# Patient Record
Sex: Female | Born: 2000 | Race: Black or African American | Hispanic: No | Smoking: Never smoker
Health system: Southern US, Community
[De-identification: ages and names within clinical notes are randomized; demographics above are authoritative.]

## PROBLEM LIST (undated history)

## (undated) DIAGNOSIS — Z789 Other specified health status: Secondary | ICD-10-CM

## (undated) HISTORY — PX: NO PAST SURGERIES: SHX2092

## (undated) HISTORY — DX: Other specified health status: Z78.9

---
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
## 2019-06-17 ENCOUNTER — Telehealth: Payer: Self-pay | Admitting: Adult Health

## 2019-06-17 NOTE — Telephone Encounter (Signed)

## 2019-06-18 ENCOUNTER — Encounter: Payer: Self-pay | Admitting: Adult Health

## 2020-03-26 NOTE — L&D Delivery Note (Signed)
[facility] Delivery Note  Sharon Webb is a 20 y.o. G1P0 at [redacted]w[redacted]d. She was admitted for SOL.   ROM: 10h 37m with clear fluid GBS Status: Neg Maximum Maternal Temperature: 98.7*F  Labor Progress: Patient presented in active labor and progressed to complete with expectant management.  Delivery Date/Time: 11/09/20 at 1426 Delivery: Called to room and patient was complete and pushing. Head delivered LOA. No nuchal cord present. Shoulder and body delivered in usual fashion. Infant with spontaneous cry, placed on mother's abdomen, dried and stimulated. Cord clamped x 2 after 1-minute delay, and cut by FOB. Cord blood drawn. Placenta delivered spontaneously with gentle cord traction. Fundus firm with massage and Pitocin. Labia, perineum, vagina, and cervix inspected with left labial laceration and second degree perineal laceration.   Placenta: intact, 3VC Complications: none Lacerations: left labial and 2nd degree perineal repaired with 3-0 vicryl on CT EBL: 200cc Analgesia: epidural  Postpartum Planning [ ]  message to sent to schedule follow-up  [ ]  vaccines UTD  Infant: viable female  APGARs 9,9  weight pending medical chart  , MD [facility], PGY-3 11/09/2020, 3:06 PM

## 2020-04-19 ENCOUNTER — Encounter (INDEPENDENT_AMBULATORY_CARE_PROVIDER_SITE_OTHER): Payer: Self-pay

## 2020-04-19 ENCOUNTER — Encounter: Payer: Self-pay | Admitting: Adult Health

## 2020-04-19 ENCOUNTER — Encounter: Payer: Medicaid Other | Admitting: Adult Health

## 2020-04-19 ENCOUNTER — Other Ambulatory Visit: Payer: Self-pay

## 2020-04-19 ENCOUNTER — Ambulatory Visit (INDEPENDENT_AMBULATORY_CARE_PROVIDER_SITE_OTHER): Payer: Medicaid Other | Admitting: Adult Health

## 2020-04-19 VITALS — BP 132/91 | HR 81 | Ht 67.0 in | Wt 108.0 lb

## 2020-04-19 DIAGNOSIS — O3680X0 Pregnancy with inconclusive fetal viability, not applicable or unspecified: Secondary | ICD-10-CM | POA: Diagnosis not present

## 2020-04-19 DIAGNOSIS — Z3201 Encounter for pregnancy test, result positive: Secondary | ICD-10-CM | POA: Diagnosis not present

## 2020-04-19 DIAGNOSIS — Z3A13 13 weeks gestation of pregnancy: Secondary | ICD-10-CM | POA: Diagnosis not present

## 2020-04-19 LAB — POCT URINE PREGNANCY: Preg Test, Ur: POSITIVE — AB

## 2020-04-19 MED ORDER — FLINTSTONES COMPLETE 18 MG PO CHEW: CHEWABLE_TABLET | ORAL | Status: DC

## 2020-04-19 NOTE — Patient Instructions (Signed)
Obstetrics: Normal and Problem Pregnancies (7th ed., pp. 102-121). [place], [place]: Elsevier."> Textbook of Family Medicine (9th ed., pp. 365-410). [place], [place]: [employer].">  First Trimester of Pregnancy  The first trimester of pregnancy starts on the first day of your last menstrual period until the end of week 12. This is months 1 through 3 of pregnancy. A week after a sperm fertilizes an egg, the egg will implant into the wall of the uterus and begin to develop into a baby. By the end of 12 weeks, all the baby's organs will be formed and the baby will be 2-3 inches in size. Body changes during your first trimester Your body goes through many changes during pregnancy. The changes vary and generally return to normal after your baby is born. Physical changes  You may gain or lose weight.  Your breasts may begin to grow larger and become tender. The tissue that surrounds your nipples (areola) may become darker.  Dark spots or blotches (chloasma or mask of pregnancy) may develop on your face.  You may have changes in your hair. These can include thickening or thinning of your hair or changes in texture. Health changes  You may feel nauseous, and you may vomit.  You may have heartburn.  You may develop headaches.  You may develop constipation.  Your gums may bleed and may be sensitive to brushing and flossing. Other changes  You may tire easily.  You may urinate more often.  Your menstrual periods will stop.  You may have a loss of appetite.  You may develop cravings for certain kinds of food.  You may have changes in your emotions from day to day.  You may have more vivid and strange dreams. Follow these instructions at home: Medicines  Follow your health care provider's instructions regarding medicine use. Specific medicines may be either safe or unsafe to take during pregnancy. Do not take any medicines unless told to by your health care provider.  Take a  prenatal vitamin that contains at least 600 micrograms (mcg) of folic acid. Eating and drinking  Eat a healthy diet that includes fresh fruits and vegetables, whole grains, good sources of protein such as meat, eggs, or tofu, and low-fat dairy products.  Avoid raw meat and unpasteurized juice, milk, and cheese. These carry germs that can harm you and your baby.  If you feel nauseous or you vomit: ? Eat 4 or 5 small meals a day instead of 3 large meals. ? Try eating a few soda crackers. ? Drink liquids between meals instead of during meals.  You may need to take these actions to prevent or treat constipation: ? Drink enough fluid to keep your urine pale yellow. ? Eat foods that are high in fiber, such as beans, whole grains, and fresh fruits and vegetables. ? Limit foods that are high in fat and processed sugars, such as fried or sweet foods. Activity  Exercise only as directed by your health care provider. Most people can continue their usual exercise routine during pregnancy. Try to exercise for 30 minutes at least 5 days a week.  Stop exercising if you develop pain or cramping in the lower abdomen or lower back.  Avoid exercising if it is very hot or humid or if you are at high altitude.  Avoid heavy lifting.  If you choose to, you may have sex unless your health care provider tells you not to. Relieving pain and discomfort  Wear a good support bra to relieve breast   tenderness.  Rest with your legs elevated if you have leg cramps or low back pain.  If you develop bulging veins (varicose veins) in your legs: ? Wear support hose as told by your health care provider. ? Elevate your feet for 15 minutes, 3-4 times a day. ? Limit salt in your diet. Safety  Wear your seat belt at all times when driving or riding in a car.  Talk with your health care provider if someone is verbally or physically abusive to you.  Talk with your health care provider if you are feeling sad or have  thoughts of hurting yourself. Lifestyle  Do not use hot tubs, steam rooms, or saunas.  Do not douche. Do not use tampons or scented sanitary pads.  Do not use herbal remedies, alcohol, illegal drugs, or medicines that are not approved by your health care provider. Chemicals in these products can harm your baby.  Do not use any products that contain nicotine or tobacco, such as cigarettes, e-cigarettes, and chewing tobacco. If you need help quitting, ask your health care provider.  Avoid cat litter boxes and soil used by cats. These carry germs that can cause birth defects in the baby and possibly loss of the unborn baby (fetus) by miscarriage or stillbirth. General instructions  During routine prenatal visits in the first trimester, your health care provider will do a physical exam, perform necessary tests, and ask you how things are going. Keep all follow-up visits. This is important.  Ask for help if you have counseling or nutritional needs during pregnancy. Your health care provider can offer advice or refer you to specialists for help with various needs.  Schedule a dentist appointment. At home, brush your teeth with a soft toothbrush. Floss gently.  Write down your questions. Take them to your prenatal visits. Where to find more information  American Pregnancy Association: americanpregnancy.org  American College of Obstetricians and Gynecologists: acog.org/en/Womens%20Health/Pregnancy  Office on Women's Health: womenshealth.gov/pregnancy Contact a health care provider if you have:  Dizziness.  A fever.  Mild pelvic cramps, pelvic pressure, or nagging pain in the abdominal area.  Nausea, vomiting, or diarrhea that lasts for 24 hours or longer.  A bad-smelling vaginal discharge.  Pain when you urinate.  Known exposure to a contagious illness, such as chickenpox, measles, Zika virus, HIV, or hepatitis. Get help right away if you have:  Spotting or bleeding from your  vagina.  Severe abdominal cramping or pain.  Shortness of breath or chest pain.  Any kind of trauma, such as from a fall or a car crash.  New or increased pain, swelling, or redness in an arm or leg. Summary  The first trimester of pregnancy starts on the first day of your last menstrual period until the end of week 12 (months 1 through 3).  Eating 4 or 5 small meals a day rather than 3 large meals may help to relieve nausea and vomiting.  Do not use any products that contain nicotine or tobacco, such as cigarettes, e-cigarettes, and chewing tobacco. If you need help quitting, ask your health care provider.  Keep all follow-up visits. This is important. This information is not intended to replace advice given to you by your health care provider. Make sure you discuss any questions you have with your health care provider. Document Revised: 08/19/2019 Document Reviewed: 06/25/2019 Elsevier Patient Education  2021 [employer].  

## 2020-04-19 NOTE — Progress Notes (Signed)
  Subjective:     Patient ID: Sharon Webb, female   DOB: [birth year], 20 y.o.   MRN: [id number]  HPI Sharon Webb is a 20 year old black female,single, in for UPT, has missed periods and had 1+HPT PCP is Dr Talmage Nap.   Review of Systems +missed periods  1+HPT Some nausea  +sleepy Reviewed past medical,surgical, social and family history. Reviewed medications and allergies.     Objective:   Physical Exam BP (!) 132/91 (BP Location: Left Arm, Patient Position: Sitting, Cuff Size: Normal)   Pulse 81   Ht 5\' 7"  (1.702 m)   Wt 108 lb (49 kg)   LMP 01/14/2020   BMI 16.92 kg/m UPT +, about 13+5 weeks by LMP with EDD 10/20/20.Skin warm and dry. Neck: mid line trachea, normal thyroid, good ROM, no lymphadenopathy noted. Lungs: clear to ausculation bilaterally. Cardiovascular: regular rate and rhythm.Abdomen is soft and non tender AA is 0 Fall risk is low PHQ 9 score is 4 GAD 7 score is 2  Upstream - 04/19/20 1553      Pregnancy Intention Screening   Does the patient want to become pregnant in the next year? N/A    Does the patient's partner want to become pregnant in the next year? N/A    Would the patient like to discuss contraceptive options today? N/A      Contraception Wrap Up   Current Method Pregnant/Seeking Pregnancy    End Method Pregnant/Seeking Pregnancy    Contraception Counseling Provided No             Assessment:     1. Pregnancy examination or test, positive result Meds ordered this encounter  Medications  . Pediatric Multivitamins-Iron (FLINTSTONES COMPLETE) 18 MG CHEW    Sig: Chew 2 daily    Order Specific Question:   Supervising Provider    Answer:   04/21/20, LUTHER H [[id number]]    2. [redacted] weeks gestation of pregnancy Try hard candy and gum,eat often if any nausea   3. Encounter to determine fetal viability of pregnancy, single or unspecified fetus Dating Despina Hidden in 1 week     Plan:     Review handout on First [facility] and by [facility]

## 2020-04-26 ENCOUNTER — Other Ambulatory Visit: Payer: Self-pay | Admitting: Adult Health

## 2020-04-26 ENCOUNTER — Other Ambulatory Visit: Payer: Self-pay

## 2020-04-26 ENCOUNTER — Ambulatory Visit (INDEPENDENT_AMBULATORY_CARE_PROVIDER_SITE_OTHER): Payer: Medicaid Other

## 2020-04-26 ENCOUNTER — Other Ambulatory Visit: Payer: Medicaid Other

## 2020-04-26 DIAGNOSIS — Z3401 Encounter for supervision of normal first pregnancy, first trimester: Secondary | ICD-10-CM

## 2020-04-26 DIAGNOSIS — O3680X0 Pregnancy with inconclusive fetal viability, not applicable or unspecified: Secondary | ICD-10-CM

## 2020-04-26 DIAGNOSIS — Z3A11 11 weeks gestation of pregnancy: Secondary | ICD-10-CM | POA: Diagnosis not present

## 2020-04-26 DIAGNOSIS — Z3A14 14 weeks gestation of pregnancy: Secondary | ICD-10-CM

## 2020-04-26 DIAGNOSIS — Z3682 Encounter for antenatal screening for nuchal translucency: Secondary | ICD-10-CM | POA: Diagnosis not present

## 2020-04-26 NOTE — Progress Notes (Signed)
[place] 11+4 wks,single IUP,FHT 166 bpm,CRL 48.82 mm,normal ovaries,NB present,NT .8 mm

## 2020-04-27 ENCOUNTER — Other Ambulatory Visit: Payer: Self-pay | Admitting: Women's Health

## 2020-04-27 LAB — CBC/D/PLT+RPR+RH+ABO+RUB AB...
Hematocrit: 32.6 % — ABNORMAL LOW (ref 34.0–46.6)
Lymphs: 39 %
MCHC: 33.4 g/dL (ref 31.5–35.7)
Rh Factor: NEGATIVE

## 2020-04-27 LAB — INTEGRATED 1

## 2020-04-27 MED ORDER — FERROUS SULFATE 325 (65 FE) MG PO TABS: 325.0000 mg | ORAL_TABLET | ORAL | 2 refills | Status: DC

## 2020-04-28 LAB — CBC/D/PLT+RPR+RH+ABO+RUB AB...
Antibody Screen: NEGATIVE
Basophils Absolute: 0 10*3/uL (ref 0.0–0.2)
Basos: 1 %
EOS (ABSOLUTE): 0.3 10*3/uL (ref 0.0–0.4)
Eos: 7 %
HCV Ab: <0.1 s/co ratio (ref 0.0–0.9)
HIV Screen 4th Generation wRfx: NONREACTIVE
Hemoglobin: 10.9 g/dL — ABNORMAL LOW (ref 11.1–15.9)
Hepatitis B Surface Ag: NEGATIVE
Immature Grans (Abs): 0 10*3/uL (ref 0.0–0.1)
Immature Granulocytes: 0 %
Lymphocytes Absolute: 1.7 10*3/uL (ref 0.7–3.1)
MCH: 27.7 pg (ref 26.6–33.0)
MCV: 83 fL (ref 79–97)
Monocytes Absolute: 0.4 10*3/uL (ref 0.1–0.9)
Monocytes: 9 %
Neutrophils Absolute: 2 10*3/uL (ref 1.4–7.0)
Neutrophils: 44 %
Platelets: 292 10*3/uL (ref 150–450)
RBC: 3.94 x10E6/uL (ref 3.77–5.28)
RDW: 13.2 % (ref 11.7–15.4)
RPR Ser Ql: NONREACTIVE
Rubella Antibodies, IGG: 3.47 index (ref >0.99)
WBC: 4.5 10*3/uL (ref 3.4–10.8)

## 2020-04-28 LAB — INTEGRATED 1
Maternal Age at EDD: 21.3 yr
Nuchal Translucency (NT): 0.8 mm
Number of Fetuses: 1
PAPP-A Value: 390.8 ng/mL
Weight: 109 [lb_av]

## 2020-04-28 LAB — HCV INTERPRETATION

## 2020-05-10 DIAGNOSIS — O26899 Other specified pregnancy related conditions, unspecified trimester: Secondary | ICD-10-CM | POA: Insufficient documentation

## 2020-05-10 DIAGNOSIS — Z3400 Encounter for supervision of normal first pregnancy, unspecified trimester: Secondary | ICD-10-CM | POA: Insufficient documentation

## 2020-05-10 DIAGNOSIS — Z6791 Unspecified blood type, Rh negative: Secondary | ICD-10-CM | POA: Insufficient documentation

## 2020-05-11 ENCOUNTER — Ambulatory Visit: Payer: Medicaid Other | Admitting: *Deleted

## 2020-05-11 ENCOUNTER — Encounter: Payer: Self-pay | Admitting: Women's Health

## 2020-05-11 ENCOUNTER — Ambulatory Visit (INDEPENDENT_AMBULATORY_CARE_PROVIDER_SITE_OTHER): Payer: Medicaid Other | Admitting: Women's Health

## 2020-05-11 ENCOUNTER — Other Ambulatory Visit: Payer: Self-pay

## 2020-05-11 VITALS — BP 121/83 | HR 120 | Wt 109.0 lb

## 2020-05-11 DIAGNOSIS — Z6791 Unspecified blood type, Rh negative: Secondary | ICD-10-CM

## 2020-05-11 DIAGNOSIS — Z3A13 13 weeks gestation of pregnancy: Secondary | ICD-10-CM

## 2020-05-11 DIAGNOSIS — O26899 Other specified pregnancy related conditions, unspecified trimester: Secondary | ICD-10-CM | POA: Diagnosis not present

## 2020-05-11 DIAGNOSIS — Z3401 Encounter for supervision of normal first pregnancy, first trimester: Secondary | ICD-10-CM

## 2020-05-11 DIAGNOSIS — Z1389 Encounter for screening for other disorder: Secondary | ICD-10-CM | POA: Diagnosis not present

## 2020-05-11 LAB — POCT URINALYSIS DIPSTICK OB
Blood, UA: NEGATIVE
Glucose, UA: NEGATIVE
Ketones, UA: NEGATIVE
Leukocytes, UA: NEGATIVE
Nitrite, UA: NEGATIVE
POC,PROTEIN,UA: NEGATIVE

## 2020-05-11 MED ORDER — ASPIRIN 81 MG PO TBEC: 81.0000 mg | DELAYED_RELEASE_TABLET | Freq: Every day | ORAL | 6 refills | Status: DC

## 2020-05-11 MED ORDER — BLOOD PRESSURE CUFF MISC: 1.0000 | Freq: Every day | 0 refills | Status: DC

## 2020-05-11 MED ORDER — DOXYLAMINE-PYRIDOXINE 10-10 MG PO TBEC: DELAYED_RELEASE_TABLET | ORAL | 6 refills | Status: DC

## 2020-05-11 MED ORDER — BLOOD PRESSURE CUFF MISC: 1.0000 | Freq: Every day | 0 refills | Status: AC

## 2020-05-11 NOTE — Progress Notes (Signed)
INITIAL OBSTETRICAL VISIT Patient name: Sharon Webb MRN [id number]  Date of birth: 10-Jul-2000 Chief Complaint:   Initial Prenatal Visit  History of Present Illness:   Sharon Webb is a 20 y.o. G1P0 African American female at [redacted]w[redacted]d by [place] at 48 weeks with an Estimated Date of Delivery: 11/11/20 being seen today for her initial obstetrical visit.   Her obstetrical history is significant for primigravida.   Today she reports n/v, requests meds.  Depression screen [facility] 2/9 05/11/2020 04/19/2020 04/19/2020  Decreased Interest 0 1 1  Down, Depressed, Hopeless 0 0 0  PHQ - 2 Score 0 1 1  Altered sleeping 0 1 1  Tired, decreased energy 0 1 1  Change in appetite 0 1 1  Feeling bad or failure about yourself  0 0 0  Trouble concentrating 0 0 0  Moving slowly or fidgety/restless 0 0 0  Suicidal thoughts 0 0 0  PHQ-9 Score 0 4 4    Patient's last menstrual period was 01/14/2020. Last pap <21yo. Results were: N/A Review of Systems:   Pertinent items are noted in HPI Denies cramping/contractions, leakage of fluid, vaginal bleeding, abnormal vaginal discharge w/ itching/odor/irritation, headaches, visual changes, shortness of breath, chest pain, abdominal pain, severe nausea/vomiting, or problems with urination or bowel movements unless otherwise stated above.  Pertinent History Reviewed:  Reviewed past medical,surgical, social, obstetrical and family history.  Reviewed problem list, medications and allergies. OB History  Gravida Para Term Preterm AB Living  1            SAB IAB Ectopic Multiple Live Births               # Outcome Date GA Lbr Len/2nd Weight Sex Delivery Anes PTL Lv  1 Current            Physical Assessment:   Vitals:   05/11/20 1507  BP: 121/83  Pulse: (!) 120  Weight: 109 lb (49.4 kg)  Body mass index is 17.07 kg/m.       Physical Examination:  General appearance - well appearing, and in no distress  Mental status - alert, oriented to person, place, and  time  Psych:  She has a normal mood and affect  Skin - warm and dry, normal color, no suspicious lesions noted  Chest - effort normal, all lung fields clear to auscultation bilaterally  Heart - normal rate and regular rhythm  Abdomen - soft, nontender  Extremities:  No swelling or varicosities noted  Thin prep pap is not done  Chaperone: N/A    TODAY'S FHR via doppler: 163  Results for orders placed or performed in visit on 05/11/20 (from the past 24 hour(s))  POC Urinalysis Dipstick OB   Collection Time: 05/11/20  3:28 PM  Result Value Ref Range   Color, UA     Clarity, UA     Glucose, UA Negative Negative   Bilirubin, UA     Ketones, UA neg    Spec Grav, UA     Blood, UA neg    pH, UA     POC,PROTEIN,UA Negative Negative, Trace, Small (1+), Moderate (2+), Large (3+), 4+   Urobilinogen, UA     Nitrite, UA neg    Leukocytes, UA Negative Negative   Appearance     Odor      Assessment & Plan:  1) Low-Risk Pregnancy G1P0 at [redacted]w[redacted]d with an Estimated Date of Delivery: 11/11/20   2) Initial OB visit  3) N/V> rx diclegis  Meds:  Meds ordered this encounter  Medications  . DISCONTD: Blood Pressure Monitoring (BLOOD PRESSURE CUFF) MISC    Sig: 1 Device by Does not apply route daily.    Dispense:  1 each    Refill:  0  . Blood Pressure Monitoring (BLOOD PRESSURE CUFF) MISC    Sig: 1 Device by Does not apply route daily.    Dispense:  1 each    Refill:  0  . Doxylamine-Pyridoxine (DICLEGIS) 10-10 MG TBEC    Sig: 2 tabs q hs, if sx persist add 1 tab q am on day 3, if sx persist add 1 tab q afternoon on day 4    Dispense:  100 tablet    Refill:  6    Order Specific Question:   Supervising Provider    Answer:   Despina Hidden, LUTHER H [[id number]]  . aspirin 81 MG EC tablet    Sig: Take 1 tablet (81 mg total) by mouth daily. Swallow whole.    Dispense:  90 tablet    Refill:  6    Order Specific Question:   Supervising Provider    Answer:   Duane Lope H [[id number]]    Initial labs  obtained Continue prenatal vitamins Reviewed n/v relief measures and warning s/s to report Reviewed recommended weight gain based on pre-gravid BMI Encouraged well-balanced diet Genetic & carrier screening discussed: requests Panorama, NT/IT and Horizon 14  Ultrasound discussed; fetal survey: requested CCNC completed> form faxed if has or is planning to apply for medicaid The nature of [facility] [employer] with multiple MDs and other Advanced Practice Providers was explained to patient; also emphasized that fellows, residents, and students are part of our team. Does not have home bp cuff.  Check bp weekly, let us know if >140/90.   Indications for ASA therapy (per uptodate) OR Two or more of the following: Nulliparity Yes  Sociodemographic characteristics (African American race, low socioeconomic level) Yes   Follow-up: Return in 30 days (on 06/10/2020) for LROB, 2nd IT, HF:WYOVZCH, in person, CNM.   Orders Placed This Encounter  Procedures  . Urine Culture  . GC/Chlamydia Probe Amp  . Pain Management Screening Profile (10S)  . POC Urinalysis Dipstick OB    Cheral Marker CNM, [facility] 05/11/2020 3:59 PM

## 2020-05-11 NOTE — Patient Instructions (Signed)
Sharon Webb, I greatly value your feedback.  If you receive a survey following your visit with [place] today, we appreciate you taking the time to fill it out.  Thanks, [name], CNM, WHNP-BC   [facility] ([address] [place], [place] [address]) Entrance C, located off of [address] Free 24/7 valet parking   Nausea & Vomiting  Have saltine crackers or pretzels by your bed and eat a few bites before you raise your head out of bed in the morning  Eat small frequent meals throughout the day instead of large meals  Drink plenty of fluids throughout the day to stay hydrated, just don't drink a lot of fluids with your meals.  This can make your stomach fill up faster making you feel sick  Do not brush your teeth right after you eat  Products with real ginger are good for nausea, like ginger ale and ginger hard candy Make sure it says made with real ginger!  Sucking on sour candy like lemon heads is also good for nausea  If your prenatal vitamins make you nauseated, take them at night so you will sleep through the nausea  Sea Bands  If you feel like you need medicine for the nausea & vomiting please let us know  If you are unable to keep any fluids or food down please let us know   Constipation  Drink plenty of fluid, preferably water, throughout the day  Eat foods high in fiber such as fruits, vegetables, and grains  Exercise, such as walking, is a good way to keep your bowels regular  Drink warm fluids, especially warm prune juice, or decaf coffee  Eat a 1/2 cup of real oatmeal (not instant), 1/2 cup applesauce, and 1/2-1 cup warm prune juice every day  If needed, you may take Colace (docusate sodium) stool softener once or twice a day to help keep the stool soft.   If you still are having problems with constipation, you may take Miralax once daily as needed to help keep your bowels regular.   Home Blood Pressure Monitoring for Patients    Your provider has recommended that you check your blood pressure (BP) at least once a week at home. If you do not have a blood pressure cuff at home, one will be provided for you. Contact your provider if you have not received your monitor within 1 week.   Helpful Tips for Accurate Home Blood Pressure Checks  . Don't smoke, exercise, or drink caffeine 30 minutes before checking your BP . Use the restroom before checking your BP (a full bladder can raise your pressure) . Relax in a comfortable upright chair . Feet on the ground . Left arm resting comfortably on a flat surface at the level of your heart . Legs uncrossed . Back supported . Sit quietly and don't talk . Place the cuff on your bare arm . Adjust snuggly, so that only two fingertips can fit between your skin and the top of the cuff . Check 2 readings separated by at least one minute . Keep a log of your BP readings . For a visual, please reference this diagram: http://ccnc.care/bpdiagram  Provider Name: [facility]     Phone: [phone]  Zone 1: ALL CLEAR  Continue to monitor your symptoms:  . BP reading is less than 140 (top number) or less than 90 (bottom number)  . No right upper stomach pain . No headaches or seeing  spots . No feeling nauseated or throwing up . No swelling in face and hands  Zone 2: CAUTION Call your doctor's office for any of the following:  . BP reading is greater than 140 (top number) or greater than 90 (bottom number)  . Stomach pain under your ribs in the middle or right side . Headaches or seeing spots . Feeling nauseated or throwing up . Swelling in face and hands  Zone 3: EMERGENCY  Seek immediate medical care if you have any of the following:  . BP reading is greater than160 (top number) or greater than 110 (bottom number) . Severe headaches not improving with Tylenol . Serious difficulty catching your breath . Any worsening symptoms from Zone 2    First Trimester of  Pregnancy The first trimester of pregnancy is from week 1 until the end of week 12 (months 1 through 3). A week after a sperm fertilizes an egg, the egg will implant on the wall of the uterus. This embryo will begin to develop into a baby. Genes from you and your partner are forming the baby. The female genes determine whether the baby is a boy or a girl. At 6-8 weeks, the eyes and face are formed, and the heartbeat can be seen on ultrasound. At the end of 12 weeks, all the baby's organs are formed.  Now that you are pregnant, you will want to do everything you can to have a healthy baby. Two of the most important things are to get good prenatal care and to follow your health care provider's instructions. Prenatal care is all the medical care you receive before the baby's birth. This care will help prevent, find, and treat any problems during the pregnancy and childbirth. BODY CHANGES Your body goes through many changes during pregnancy. The changes vary from woman to woman.   You may gain or lose a couple of pounds at first.  You may feel sick to your stomach (nauseous) and throw up (vomit). If the vomiting is uncontrollable, call your health care provider.  You may tire easily.  You may develop headaches that can be relieved by medicines approved by your health care provider.  You may urinate more often. Painful urination may mean you have a bladder infection.  You may develop heartburn as a result of your pregnancy.  You may develop constipation because certain hormones are causing the muscles that push waste through your intestines to slow down.  You may develop hemorrhoids or swollen, bulging veins (varicose veins).  Your breasts may begin to grow larger and become tender. Your nipples may stick out more, and the tissue that surrounds them (areola) may become darker.  Your gums may bleed and may be sensitive to brushing and flossing.  Dark spots or blotches (chloasma, mask of pregnancy)  may develop on your face. This will likely fade after the baby is born.  Your menstrual periods will stop.  You may have a loss of appetite.  You may develop cravings for certain kinds of food.  You may have changes in your emotions from day to day, such as being excited to be pregnant or being concerned that something may go wrong with the pregnancy and baby.  You may have more vivid and strange dreams.  You may have changes in your hair. These can include thickening of your hair, rapid growth, and changes in texture. Some women also have hair loss during or after pregnancy, or hair that feels dry or thin. Your hair  will most likely return to normal after your baby is born. WHAT TO EXPECT AT YOUR PRENATAL VISITS During a routine prenatal visit:  You will be weighed to make sure you and the baby are growing normally.  Your blood pressure will be taken.  Your abdomen will be measured to track your baby's growth.  The fetal heartbeat will be listened to starting around week 10 or 12 of your pregnancy.  Test results from any previous visits will be discussed. Your health care provider may ask you:  How you are feeling.  If you are feeling the baby move.  If you have had any abnormal symptoms, such as leaking fluid, bleeding, severe headaches, or abdominal cramping.  If you have any questions. Other tests that may be performed during your first trimester include:  Blood tests to find your blood type and to check for the presence of any previous infections. They will also be used to check for low iron levels (anemia) and Rh antibodies. Later in the pregnancy, blood tests for diabetes will be done along with other tests if problems develop.  Urine tests to check for infections, diabetes, or protein in the urine.  An ultrasound to confirm the proper growth and development of the baby.  An amniocentesis to check for possible genetic problems.  Fetal screens for spina bifida and  Down syndrome.  You may need other tests to make sure you and the baby are doing well. HOME CARE INSTRUCTIONS  Medicines  Follow your health care provider's instructions regarding medicine use. Specific medicines may be either safe or unsafe to take during pregnancy.  Take your prenatal vitamins as directed.  If you develop constipation, try taking a stool softener if your health care provider approves. Diet  Eat regular, well-balanced meals. Choose a variety of foods, such as meat or vegetable-based protein, fish, milk and low-fat dairy products, vegetables, fruits, and whole grain breads and cereals. Your health care provider will help you determine the amount of weight gain that is right for you.  Avoid raw meat and uncooked cheese. These carry germs that can cause birth defects in the baby.  Eating four or five small meals rather than three large meals a day may help relieve nausea and vomiting. If you start to feel nauseous, eating a few soda crackers can be helpful. Drinking liquids between meals instead of during meals also seems to help nausea and vomiting.  If you develop constipation, eat more high-fiber foods, such as fresh vegetables or fruit and whole grains. Drink enough fluids to keep your urine clear or pale yellow. Activity and Exercise  Exercise only as directed by your health care provider. Exercising will help you:  Control your weight.  Stay in shape.  Be prepared for labor and delivery.  Experiencing pain or cramping in the lower abdomen or low back is a good sign that you should stop exercising. Check with your health care provider before continuing normal exercises.  Try to avoid standing for long periods of time. Move your legs often if you must stand in one place for a long time.  Avoid heavy lifting.  Wear low-heeled shoes, and practice good posture.  You may continue to have sex unless your health care provider directs you otherwise. Relief of Pain  or Discomfort  Wear a good support bra for breast tenderness.    Take warm sitz baths to soothe any pain or discomfort caused by hemorrhoids. Use hemorrhoid cream if your health care provider approves.  Rest with your legs elevated if you have leg cramps or low back pain.  If you develop varicose veins in your legs, wear support hose. Elevate your feet for 15 minutes, 3-4 times a day. Limit salt in your diet. Prenatal Care  Schedule your prenatal visits by the twelfth week of pregnancy. They are usually scheduled monthly at first, then more often in the last 2 months before delivery.  Write down your questions. Take them to your prenatal visits.  Keep all your prenatal visits as directed by your health care provider. Safety  Wear your seat belt at all times when driving.  Make a list of emergency phone numbers, including numbers for family, friends, the hospital, and police and fire departments. General Tips  Ask your health care provider for a referral to a local prenatal education class. Begin classes no later than at the beginning of month 6 of your pregnancy.  Ask for help if you have counseling or nutritional needs during pregnancy. Your health care provider can offer advice or refer you to specialists for help with various needs.  Do not use hot tubs, steam rooms, or saunas.  Do not douche or use tampons or scented sanitary pads.  Do not cross your legs for long periods of time.  Avoid cat litter boxes and soil used by cats. These carry germs that can cause birth defects in the baby and possibly loss of the fetus by miscarriage or stillbirth.  Avoid all smoking, herbs, alcohol, and medicines not prescribed by your health care provider. Chemicals in these affect the formation and growth of the baby.  Schedule a dentist appointment. At home, brush your teeth with a soft toothbrush and be gentle when you floss. SEEK MEDICAL CARE IF:   You have dizziness.  You have mild  pelvic cramps, pelvic pressure, or nagging pain in the abdominal area.  You have persistent nausea, vomiting, or diarrhea.  You have a bad smelling vaginal discharge.  You have pain with urination.  You notice increased swelling in your face, hands, legs, or ankles. SEEK IMMEDIATE MEDICAL CARE IF:   You have a fever.  You are leaking fluid from your vagina.  You have spotting or bleeding from your vagina.  You have severe abdominal cramping or pain.  You have rapid weight gain or loss.  You vomit blood or material that looks like coffee grounds.  You are exposed to [place] measles and have never had them.  You are exposed to fifth disease or chickenpox.  You develop a severe headache.  You have shortness of breath.  You have any kind of trauma, such as from a fall or a car accident. Document Released: 03/06/2001 Document Revised: 07/27/2013 Document Reviewed: 01/20/2013 [facility] Patient Information 2015 [place], [place]. This information is not intended to replace advice given to you by your health care provider. Make sure you discuss any questions you have with your health care provider.

## 2020-05-12 LAB — PMP SCREEN PROFILE (10S), URINE
Amphetamine Scrn, Ur: NEGATIVE ng/mL
BENZODIAZEPINE SCREEN, URINE: NEGATIVE ng/mL
Ph of Urine: 6.2 (ref 4.5–8.9)
Phencyclidine Qn, Ur: NEGATIVE ng/mL

## 2020-05-13 LAB — PMP SCREEN PROFILE (10S), URINE
BARBITURATE SCREEN URINE: NEGATIVE ng/mL
CANNABINOIDS UR QL SCN: NEGATIVE ng/mL
Creatinine(Crt), U: 122.4 mg/dL (ref 20.0–300.0)
Methadone Screen, Urine: NEGATIVE ng/mL
OXYCODONE+OXYMORPHONE UR QL SCN: NEGATIVE ng/mL
Opiate Scrn, Ur: NEGATIVE ng/mL
Propoxyphene Scrn, Ur: NEGATIVE ng/mL

## 2020-05-13 LAB — URINE CULTURE

## 2020-05-24 ENCOUNTER — Encounter: Payer: Self-pay | Admitting: Women's Health

## 2020-05-24 DIAGNOSIS — D573 Sickle-cell trait: Secondary | ICD-10-CM | POA: Insufficient documentation

## 2020-06-10 ENCOUNTER — Other Ambulatory Visit: Payer: Self-pay | Admitting: Women's Health

## 2020-06-10 DIAGNOSIS — Z363 Encounter for antenatal screening for malformations: Secondary | ICD-10-CM

## 2020-06-13 ENCOUNTER — Encounter: Payer: Self-pay | Admitting: Women's Health

## 2020-06-13 ENCOUNTER — Other Ambulatory Visit: Payer: Self-pay

## 2020-06-13 ENCOUNTER — Ambulatory Visit (INDEPENDENT_AMBULATORY_CARE_PROVIDER_SITE_OTHER): Payer: Medicaid Other

## 2020-06-13 ENCOUNTER — Ambulatory Visit (INDEPENDENT_AMBULATORY_CARE_PROVIDER_SITE_OTHER): Payer: Medicaid Other | Admitting: Women's Health

## 2020-06-13 VITALS — BP 112/68 | HR 91 | Wt 118.0 lb

## 2020-06-13 DIAGNOSIS — D573 Sickle-cell trait: Secondary | ICD-10-CM

## 2020-06-13 DIAGNOSIS — Z3402 Encounter for supervision of normal first pregnancy, second trimester: Secondary | ICD-10-CM

## 2020-06-13 DIAGNOSIS — Z363 Encounter for antenatal screening for malformations: Secondary | ICD-10-CM | POA: Diagnosis not present

## 2020-06-13 DIAGNOSIS — O26899 Other specified pregnancy related conditions, unspecified trimester: Secondary | ICD-10-CM

## 2020-06-13 DIAGNOSIS — Z3A18 18 weeks gestation of pregnancy: Secondary | ICD-10-CM | POA: Diagnosis not present

## 2020-06-13 DIAGNOSIS — Z1379 Encounter for other screening for genetic and chromosomal anomalies: Secondary | ICD-10-CM

## 2020-06-13 DIAGNOSIS — Z113 Encounter for screening for infections with a predominantly sexual mode of transmission: Secondary | ICD-10-CM

## 2020-06-13 DIAGNOSIS — Z6791 Unspecified blood type, Rh negative: Secondary | ICD-10-CM

## 2020-06-13 NOTE — Progress Notes (Signed)
   LOW-RISK PREGNANCY VISIT Patient name: Sharon Webb MRN [id number]  Date of birth: 2001-02-24 Chief Complaint:   Routine Prenatal Visit (2nd IT/ [place])  History of Present Illness:   Sharon Webb is a 20 y.o. G1P0 female at [redacted]w[redacted]d with an Estimated Date of Delivery: 11/11/20 being seen today for ongoing management of a low-risk pregnancy.  Depression screen [facility] 2/9 05/11/2020 04/19/2020 04/19/2020  Decreased Interest 0 1 1  Down, Depressed, Hopeless 0 0 0  PHQ - 2 Score 0 1 1  Altered sleeping 0 1 1  Tired, decreased energy 0 1 1  Change in appetite 0 1 1  Feeling bad or failure about yourself  0 0 0  Trouble concentrating 0 0 0  Moving slowly or fidgety/restless 0 0 0  Suicidal thoughts 0 0 0  PHQ-9 Score 0 4 4    Today she reports no complaints. Contractions: Not present. Vag. Bleeding: None.  Movement: Absent. denies leaking of fluid. Review of Systems:   Pertinent items are noted in HPI Denies abnormal vaginal discharge w/ itching/odor/irritation, headaches, visual changes, shortness of breath, chest pain, abdominal pain, severe nausea/vomiting, or problems with urination or bowel movements unless otherwise stated above. Pertinent History Reviewed:  Reviewed past medical,surgical, social, obstetrical and family history.  Reviewed problem list, medications and allergies. Physical Assessment:   Vitals:   06/13/20 1427  BP: 112/68  Pulse: 91  Weight: 118 lb (53.5 kg)  Body mass index is 18.48 kg/m.        Physical Examination:   General appearance: Well appearing, and in no distress  Mental status: Alert, oriented to person, place, and time  Skin: Warm & dry  Cardiovascular: Normal heart rate noted  Respiratory: Normal respiratory effort, no distress  Abdomen: Soft, gravid, nontender  Pelvic: Cervical exam deferred         Extremities: Edema: None  Fetal Status: Fetal Heart Rate (bpm): 140 u/s   Movement: Absent    [place] 18+3 wks,cephalic,cx 2.8 cm,posterior placenta gr  0,normal ovaries,svp of fluid 5.3 cm,fhr 140 bpm,two LVEIC (#1) 2.3 (#2) 2.2 mm,FHR 140 bpm,EFW 246 g 52%,anatomy complete  Chaperone: N/A   No results found for this or any previous visit (from the past 24 hour(s)).  Assessment & Plan:  1) Low-risk pregnancy G1P0 at [redacted]w[redacted]d with an Estimated Date of Delivery: 11/11/20   2) Fetal LVEICF x 2, neg Panorama (had Tish pull report), discussed w/ pt and gave printed info  3) +SCT> FOB not being tested, asked family and no one has trait   Meds: No orders of the defined types were placed in this encounter.  Labs/procedures today: U/S and 2nd IT  Plan:  Continue routine obstetrical care  Next visit: prefers online    Reviewed: Preterm labor symptoms and general obstetric precautions including but not limited to vaginal bleeding, contractions, leaking of fluid and fetal movement were reviewed in detail with the patient.  All questions were answered. Has home bp cuff. Check bp weekly, let us know if >140/90.   Follow-up: Return in about 4 weeks (around 07/11/2020) for LROB, CNM, MyChart Video.  Future Appointments  Date Time Provider Department Center  07/13/2020  2:10 PM Cresenzo-Dishmon, Scarlette Calico, CNM [facility]    Orders Placed This Encounter  Procedures  . GC/Chlamydia Probe Amp  . INTEGRATED 2   Cheral Marker CNM, [facility] 06/13/2020 3:26 PM

## 2020-06-13 NOTE — Progress Notes (Signed)
[place] 18+3 wks,cephalic,cx 2.8 cm,posterior placenta gr 0,normal ovaries,svp of fluid 5.3 cm,fhr 140 bpm,two LVEIC (#1) 2.3 (#2) 2.2 mm,FHR 140 bpm,EFW 246 g 52%,anatomy complete

## 2020-06-13 NOTE — Patient Instructions (Signed)
Sharon Webb, I greatly value your feedback.  If you receive a survey following your visit with [place] today, we appreciate you taking the time to fill it out.  Thanks, [name], CNM, WHNP-BC  [facility] ([address] [place], [place] [address]) Entrance C, located off of [address] valet parking  Go to [email or web address] to register for FREE online childbirth classes  [place] Pediatricians/Family Doctors:  [facility] [phone]            [facility] [phone]                 [facility] [phone] (usually not accepting new patients unless you have family there already, you are always welcome to call and ask)       [facility] [phone]       [facility] Pediatricians/Family Doctors:   [facility]: [phone]  [facility]: [phone]  [facility]: [phone]  [facility] Doctors:   [facility]: [phone]   [facility]: [phone]  [facility] Doctors:  [facility]: [phone]    Home Blood Pressure Monitoring for Patients   Your provider has recommended that you check your blood pressure (BP) at least once a week at home. If you do not have a blood pressure cuff at home, one will be provided for you. Contact your provider if you have not received your monitor within 1 week.   Helpful Tips for Accurate Home Blood Pressure Checks  . Don't smoke, exercise, or drink caffeine 30 minutes before checking your BP . Use the restroom before checking your BP (a full bladder can raise your pressure) . Relax in a comfortable upright chair . Feet on the ground . Left arm resting comfortably on a flat surface at the level of your heart . Legs uncrossed . Back supported . Sit quietly and don't talk . Place the cuff on your bare arm . Adjust snuggly, so  that only two fingertips can fit between your skin and the top of the cuff . Check 2 readings separated by at least one minute . Keep a log of your BP readings . For a visual, please reference this diagram: http://ccnc.care/bpdiagram  Provider Name: [facility]     Phone: [phone]  Zone 1: ALL CLEAR  Continue to monitor your symptoms:  . BP reading is less than 140 (top number) or less than 90 (bottom number)  . No right upper stomach pain . No headaches or seeing spots . No feeling nauseated or throwing up . No swelling in face and hands  Zone 2: CAUTION Call your doctor's office for any of the following:  . BP reading is greater than 140 (top number) or greater than 90 (bottom number)  . Stomach pain under your ribs in the middle or right side . Headaches or seeing spots . Feeling nauseated or throwing up . Swelling in face and hands  Zone 3: EMERGENCY  Seek immediate medical care if you have any of the following:  . BP reading is greater than160 (top number) or greater than 110 (bottom number) . Severe headaches not improving with Tylenol . Serious difficulty catching your breath . Any worsening symptoms from Zone 2     Second Trimester of Pregnancy The second trimester is from week 14 through week 27 (months 4 through 6). The second trimester is often a time when you feel your best.  Your body has adjusted to being pregnant, and you begin to feel better physically. Usually, morning sickness has lessened or quit completely, you may have more energy, and you may have an increase in appetite. The second trimester is also a time when the fetus is growing rapidly. At the end of the sixth month, the fetus is about 9 inches long and weighs about 1 pounds. You will likely begin to feel the baby move (quickening) between 16 and 20 weeks of pregnancy. Body changes during your second trimester Your body continues to go through many changes during your second trimester. The  changes vary from woman to woman.  Your weight will continue to increase. You will notice your lower abdomen bulging out.  You may begin to get stretch marks on your hips, abdomen, and breasts.  You may develop headaches that can be relieved by medicines. The medicines should be approved by your health care provider.  You may urinate more often because the fetus is pressing on your bladder.  You may develop or continue to have heartburn as a result of your pregnancy.  You may develop constipation because certain hormones are causing the muscles that push waste through your intestines to slow down.  You may develop hemorrhoids or swollen, bulging veins (varicose veins).  You may have back pain. This is caused by: ? Weight gain. ? Pregnancy hormones that are relaxing the joints in your pelvis. ? A shift in weight and the muscles that support your balance.  Your breasts will continue to grow and they will continue to become tender.  Your gums may bleed and may be sensitive to brushing and flossing.  Dark spots or blotches (chloasma, mask of pregnancy) may develop on your face. This will likely fade after the baby is born.  A dark line from your belly button to the pubic area (linea nigra) may appear. This will likely fade after the baby is born.  You may have changes in your hair. These can include thickening of your hair, rapid growth, and changes in texture. Some women also have hair loss during or after pregnancy, or hair that feels dry or thin. Your hair will most likely return to normal after your baby is born.  What to expect at prenatal visits During a routine prenatal visit:  You will be weighed to make sure you and the fetus are growing normally.  Your blood pressure will be taken.  Your abdomen will be measured to track your baby's growth.  The fetal heartbeat will be listened to.  Any test results from the previous visit will be discussed.  Your health care  provider may ask you:  How you are feeling.  If you are feeling the baby move.  If you have had any abnormal symptoms, such as leaking fluid, bleeding, severe headaches, or abdominal cramping.  If you are using any tobacco products, including cigarettes, chewing tobacco, and electronic cigarettes.  If you have any questions.  Other tests that may be performed during your second trimester include:  Blood tests that check for: ? Low iron levels (anemia). ? High blood sugar that affects pregnant women (gestational diabetes) between 64 and 28 weeks. ? Rh antibodies. This is to check for a protein on red blood cells (Rh factor).  Urine tests to check for infections, diabetes, or protein in the urine.  An ultrasound to confirm the proper growth and development of the baby.  An amniocentesis to check for possible genetic problems.  Fetal screens  for spina bifida and Down syndrome.  HIV (human immunodeficiency virus) testing. Routine prenatal testing includes screening for HIV, unless you choose not to have this test.  Follow these instructions at home: Medicines  Follow your health care provider's instructions regarding medicine use. Specific medicines may be either safe or unsafe to take during pregnancy.  Take a prenatal vitamin that contains at least 600 micrograms (mcg) of folic acid.  If you develop constipation, try taking a stool softener if your health care provider approves. Eating and drinking  Eat a balanced diet that includes fresh fruits and vegetables, whole grains, good sources of protein such as meat, eggs, or tofu, and low-fat dairy. Your health care provider will help you determine the amount of weight gain that is right for you.  Avoid raw meat and uncooked cheese. These carry germs that can cause birth defects in the baby.  If you have low calcium intake from food, talk to your health care provider about whether you should take a daily calcium  supplement.  Limit foods that are high in fat and processed sugars, such as fried and sweet foods.  To prevent constipation: ? Drink enough fluid to keep your urine clear or pale yellow. ? Eat foods that are high in fiber, such as fresh fruits and vegetables, whole grains, and beans. Activity  Exercise only as directed by your health care provider. Most women can continue their usual exercise routine during pregnancy. Try to exercise for 30 minutes at least 5 days a week. Stop exercising if you experience uterine contractions.  Avoid heavy lifting, wear low heel shoes, and practice good posture.  A sexual relationship may be continued unless your health care provider directs you otherwise. Relieving pain and discomfort  Wear a good support bra to prevent discomfort from breast tenderness.  Take warm sitz baths to soothe any pain or discomfort caused by hemorrhoids. Use hemorrhoid cream if your health care provider approves.  Rest with your legs elevated if you have leg cramps or low back pain.  If you develop varicose veins, wear support hose. Elevate your feet for 15 minutes, 3-4 times a day. Limit salt in your diet. Prenatal Care  Write down your questions. Take them to your prenatal visits.  Keep all your prenatal visits as told by your health care provider. This is important. Safety  Wear your seat belt at all times when driving.  Make a list of emergency phone numbers, including numbers for family, friends, the hospital, and police and fire departments. General instructions  Ask your health care provider for a referral to a local prenatal education class. Begin classes no later than the beginning of month 6 of your pregnancy.  Ask for help if you have counseling or nutritional needs during pregnancy. Your health care provider can offer advice or refer you to specialists for help with various needs.  Do not use hot tubs, steam rooms, or saunas.  Do not douche or use  tampons or scented sanitary pads.  Do not cross your legs for long periods of time.  Avoid cat litter boxes and soil used by cats. These carry germs that can cause birth defects in the baby and possibly loss of the fetus by miscarriage or stillbirth.  Avoid all smoking, herbs, alcohol, and unprescribed drugs. Chemicals in these products can affect the formation and growth of the baby.  Do not use any products that contain nicotine or tobacco, such as cigarettes and e-cigarettes. If you need help quitting,  ask your health care provider.  Visit your dentist if you have not gone yet during your pregnancy. Use a soft toothbrush to brush your teeth and be gentle when you floss. Contact a health care provider if:  You have dizziness.  You have mild pelvic cramps, pelvic pressure, or nagging pain in the abdominal area.  You have persistent nausea, vomiting, or diarrhea.  You have a bad smelling vaginal discharge.  You have pain when you urinate. Get help right away if:  You have a fever.  You are leaking fluid from your vagina.  You have spotting or bleeding from your vagina.  You have severe abdominal cramping or pain.  You have rapid weight gain or weight loss.  You have shortness of breath with chest pain.  You notice sudden or extreme swelling of your face, hands, ankles, feet, or legs.  You have not felt your baby move in over an hour.  You have severe headaches that do not go away when you take medicine.  You have vision changes. Summary  The second trimester is from week 14 through week 27 (months 4 through 6). It is also a time when the fetus is growing rapidly.  Your body goes through many changes during pregnancy. The changes vary from woman to woman.  Avoid all smoking, herbs, alcohol, and unprescribed drugs. These chemicals affect the formation and growth your baby.  Do not use any tobacco products, such as cigarettes, chewing tobacco, and e-cigarettes. If you  need help quitting, ask your health care provider.  Contact your health care provider if you have any questions. Keep all prenatal visits as told by your health care provider. This is important. This information is not intended to replace advice given to you by your health care provider. Make sure you discuss any questions you have with your health care provider. Document Released: 03/06/2001 Document Revised: 08/18/2015 Document Reviewed: 05/13/2012 Elsevier Interactive Patient Education  2017 [employer].

## 2020-06-14 ENCOUNTER — Encounter: Payer: Self-pay | Admitting: Women's Health

## 2020-06-14 DIAGNOSIS — A749 Chlamydial infection, unspecified: Secondary | ICD-10-CM | POA: Insufficient documentation

## 2020-06-14 LAB — GC/CHLAMYDIA PROBE AMP
Chlamydia trachomatis, NAA: POSITIVE — AB
Neisseria Gonorrhoeae by PCR: NEGATIVE

## 2020-06-14 MED ORDER — AZITHROMYCIN 500 MG PO TABS: 1000.0000 mg | ORAL_TABLET | Freq: Once | ORAL | 0 refills | Status: AC

## 2020-06-14 NOTE — Addendum Note (Signed)
Addended by: Cheral Marker on: 06/14/2020 01:52 PM   Modules accepted: Orders

## 2020-06-15 LAB — INTEGRATED 2
AFP MoM: 0.74
Alpha-Fetoprotein: 41.9 ng/mL
Crown Rump Length: 48.8 mm
DIA MoM: 1.03
DIA Value: 202.7 pg/mL
Estriol, Unconjugated: 1.71 ng/mL
Gest. Age on Collection Date: 11.4 weeks
Gestational Age: 18.3 weeks
Maternal Age at EDD: 21.3 yr
Nuchal Translucency (NT): 0.8 mm
Nuchal Translucency MoM: 0.72
Number of Fetuses: 1
PAPP-A MoM: 0.41
PAPP-A Value: 390.8 ng/mL
Test Results:: NEGATIVE
Weight: 109 [lb_av]
Weight: 109 [lb_av]
hCG MoM: 0.91
hCG Value: 27 IU/mL
uE3 MoM: 1.04

## 2020-06-21 ENCOUNTER — Encounter: Payer: Self-pay | Admitting: Women's Health

## 2020-07-13 ENCOUNTER — Telehealth (INDEPENDENT_AMBULATORY_CARE_PROVIDER_SITE_OTHER): Payer: Medicaid Other | Admitting: Advanced Practice Midwife

## 2020-07-13 VITALS — BP 123/75 | HR 67

## 2020-07-13 DIAGNOSIS — Z3402 Encounter for supervision of normal first pregnancy, second trimester: Secondary | ICD-10-CM

## 2020-07-13 DIAGNOSIS — Z3A22 22 weeks gestation of pregnancy: Secondary | ICD-10-CM

## 2020-07-13 MED ORDER — ONDANSETRON 4 MG PO TBDP: 4.0000 mg | ORAL_TABLET | Freq: Four times a day (QID) | ORAL | 2 refills | Status: DC | PRN

## 2020-07-13 NOTE — Patient Instructions (Signed)
1. Before your test, do not eat or drink anything for 8-10 hours prior to your  appointment (a small amount of water is allowed and you may take any medicines you normally take). Be sure to drink lots of water the day before. 2. When you arrive, your blood will be drawn for a 'fasting' blood sugar level.  Then you will be given a sweetened carbonated beverage to drink. You should  complete drinking this beverage within five minutes. After finishing the  beverage, you will have your blood drawn exactly 1 and 2 hours later. Having  your blood drawn on time is an important part of this test. A total of three blood  samples will be done. 3. The test takes approximately 2  hours. During the test, do not have anything to  eat or drink. Do not smoke, chew gum (not even sugarless gum) or use breath mints.  4. During the test you should remain close by and seated as much as possible and  avoid walking around. You may want to bring a book or something else to  occupy your time.  5. After your test, you may eat and drink as normal. You may want to bring a snack  to eat after the test is finished. Your provider will advise you as to the results of  this test and any follow-up if necessary  If your sugar test is positive for gestational diabetes, you will be given an phone call and further instructions discussed. If you wish to know all of your test results before your next appointment, feel free to call the office, or look up your test results on Mychart.  (The range that the lab uses for normal values of the sugar test are not necessarily the range that is used for pregnant women; if your results are within the normal range, they are definitely normal.  However, if a value is deemed "high" by the lab, it may not be too high for a pregnant woman.  We will need to discuss the results if your value(s) fall in the "high" category).     Tdap Vaccine  It is recommended that you get the Tdap vaccine during the  third trimester of EACH pregnancy to help protect your baby from getting pertussis (whooping cough)  27-36 weeks is the BEST time to do this so that you can pass the protection on to your baby. During pregnancy is better than after pregnancy, but if you are unable to get it during pregnancy it will be offered at the hospital.  You will be offered this vaccine in the office after 27 weeks.  If you do not have health insurance, you can get the vaccine from the [facility] Department (no appointment needed).   Everyone who will be around your baby should also be up-to-date on their vaccines. Adults (who are not pregnant) only need 1 dose of Tdap during adulthood.      Contraception Choices Contraception, also called birth control, refers to methods or devices that prevent pregnancy. Hormonal methods Contraceptive implant A contraceptive implant is a thin, plastic tube that contains a hormone that prevents pregnancy. It is different from an intrauterine device (IUD). It is inserted into the upper part of the arm by a health care provider. Implants can be effective for up to 3 years. Progestin-only injections Progestin-only injections are injections of progestin, a synthetic form of the hormone progesterone. They are given every 3 months by a health care provider. Birth  control pills Birth control pills are pills that contain hormones that prevent pregnancy. They must be taken once a day, preferably at the same time each day. A prescription is needed to use this method of contraception. Birth control patch The birth control patch contains hormones that prevent pregnancy. It is placed on the skin and must be changed once a week for three weeks and removed on the fourth week. A prescription is needed to use this method of contraception. Vaginal ring A vaginal ring contains hormones that prevent pregnancy. It is placed in the vagina for three weeks and removed on the fourth week. After  that, the process is repeated with a new ring. A prescription is needed to use this method of contraception. Emergency contraceptive Emergency contraceptives prevent pregnancy after unprotected sex. They come in pill form and can be taken up to 5 days after sex. They work best the sooner they are taken after having sex. Most emergency contraceptives are available without a prescription. This method should not be used as your only form of birth control.   Barrier methods Female condom A female condom is a thin sheath that is worn over the penis during sex. Condoms keep sperm from going inside a woman's body. They can be used with a sperm-killing substance (spermicide) to increase their effectiveness. They should be thrown away after one use. Female condom A female condom is a soft, loose-fitting sheath that is put into the vagina before sex. The condom keeps sperm from going inside a woman's body. They should be thrown away after one use. Diaphragm A diaphragm is a soft, dome-shaped barrier. It is inserted into the vagina before sex, along with a spermicide. The diaphragm blocks sperm from entering the uterus, and the spermicide kills sperm. A diaphragm should be left in the vagina for 6-8 hours after sex and removed within 24 hours. A diaphragm is prescribed and fitted by a health care provider. A diaphragm should be replaced every 1-2 years, after giving birth, after gaining more than 15 lb (6.8 kg), and after pelvic surgery. Cervical cap A cervical cap is a round, soft latex or plastic cup that fits over the cervix. It is inserted into the vagina before sex, along with spermicide. It blocks sperm from entering the uterus. The cap should be left in place for 6-8 hours after sex and removed within 48 hours. A cervical cap must be prescribed and fitted by a health care provider. It should be replaced every 2 years. Sponge A sponge is a soft, circular piece of polyurethane foam with spermicide in it. The  sponge helps block sperm from entering the uterus, and the spermicide kills sperm. To use it, you make it wet and then insert it into the vagina. It should be inserted before sex, left in for at least 6 hours after sex, and removed and thrown away within 30 hours. Spermicides Spermicides are chemicals that kill or block sperm from entering the cervix and uterus. They can come as a cream, jelly, suppository, foam, or tablet. A spermicide should be inserted into the vagina with an applicator at least 10-15 minutes before sex to allow time for it to work. The process must be repeated every time you have sex. Spermicides do not require a prescription.   Intrauterine contraception Intrauterine device (IUD) An IUD is a T-shaped device that is put in a woman's uterus. There are two types:  Hormone IUD.This type contains progestin, a synthetic form of the hormone progesterone. This type  can stay in place for 3-5 years.  Copper IUD.This type is wrapped in copper wire. It can stay in place for 10 years. Permanent methods of contraception Female tubal ligation In this method, a woman's fallopian tubes are sealed, tied, or blocked during surgery to prevent eggs from traveling to the uterus. Hysteroscopic sterilization In this method, a small, flexible insert is placed into each fallopian tube. The inserts cause scar tissue to form in the fallopian tubes and block them, so sperm cannot reach an egg. The procedure takes about 3 months to be effective. Another form of birth control must be used during those 3 months. Female sterilization This is a procedure to tie off the tubes that carry sperm (vasectomy). After the procedure, the man can still ejaculate fluid (semen). Another form of birth control must be used for 3 months after the procedure. Natural planning methods Natural family planning In this method, a couple does not have sex on days when the woman could become pregnant. Calendar method In this method,  the woman keeps track of the length of each menstrual cycle, identifies the days when pregnancy can happen, and does not have sex on those days. Ovulation method In this method, a couple avoids sex during ovulation. Symptothermal method This method involves not having sex during ovulation. The woman typically checks for ovulation by watching changes in her temperature and in the consistency of cervical mucus. Post-ovulation method In this method, a couple waits to have sex until after ovulation. Where to find more information  Centers for Disease Control and Prevention: [employer] Summary  Contraception, also called birth control, refers to methods or devices that prevent pregnancy.  Hormonal methods of contraception include implants, injections, pills, patches, vaginal rings, and emergency contraceptives.  Barrier methods of contraception can include female condoms, female condoms, diaphragms, cervical caps, sponges, and spermicides.  There are two types of IUDs (intrauterine devices). An IUD can be put in a woman's uterus to prevent pregnancy for 3-5 years.  Permanent sterilization can be done through a procedure for males and females. Natural family planning methods involve nothaving sex on days when the woman could become pregnant. This information is not intended to replace advice given to you by your health care provider. Make sure you discuss any questions you have with your health care provider. Document Revised: 08/17/2019 Document Reviewed: 08/17/2019 Elsevier Patient Education  2021 [employer].

## 2020-07-13 NOTE — Progress Notes (Signed)
   I connected with Sharon Webb 07/13/20 at  2:10 PM EDT by: MyChart video and verified that I am speaking with the correct person using two identifiers.  Patient is located at home  and provider is located at [facility].     The purpose of this virtual visit is to provide medical care while limiting exposure to the novel coronavirus. I discussed the limitations, risks, security and privacy concerns of performing an evaluation and management service by MyChart video and the availability of in person appointments. I also discussed with the patient that there may be a patient responsible charge related to this service. By engaging in this virtual visit, you consent to the provision of healthcare.  Additionally, you authorize for your insurance to be billed for the services provided during this visit.  The patient expressed understanding and agreed to proceed.  The following staff members participated in the virtual visit:  Faith Rogue, LPN    TELE-PRENATAL VISIT NOTE  Subjective:  Sharon Webb is a 20 y.o. G1P0 at [redacted]w[redacted]d  for phone visit for ongoing prenatal care.  She is currently monitored for the following issues for this low-risk pregnancy and has Supervision of normal first pregnancy; Rh negative state in antepartum period; Sickle cell trait (HCC); and Chlamydia on their problem list.  Patient reports no complaints.  Contractions: Not present. Vag. Bleeding: None.  Movement: Present. Denies leaking of fluid.   The following portions of the patient's history were reviewed and updated as appropriate: allergies, current medications, past family history, past medical history, past social history, past surgical history and problem list.   Objective:   Vitals:   07/13/20 1409  BP: 123/75  Pulse: 67   Self-Obtained  Fetal Status:     Movement: Present     Assessment and Plan:  Pregnancy: G1P0 at [redacted]w[redacted]d Preterm labor symptoms and general obstetric precautions including but not limited to vaginal  bleeding, contractions, leaking of fluid and fetal movement were reviewed in detail with the patient.  Return in about 4 weeks (around 08/10/2020) for PN2/LROB.  No future appointments.   Time spent on virtual visit: 10 minutes  Jacklyn Shell, CNM

## 2020-08-15 ENCOUNTER — Other Ambulatory Visit (HOSPITAL_COMMUNITY)
Admission: RE | Admit: 2020-08-15 | Discharge: 2020-08-15 | Disposition: A | Discharge status: Home / Self Care | Payer: Medicaid Other | Source: Ambulatory Visit | Attending: Obstetrics & Gynecology | Admitting: Obstetrics & Gynecology

## 2020-08-15 ENCOUNTER — Encounter: Payer: Self-pay | Admitting: Women's Health

## 2020-08-15 ENCOUNTER — Other Ambulatory Visit: Payer: Self-pay

## 2020-08-15 ENCOUNTER — Ambulatory Visit (INDEPENDENT_AMBULATORY_CARE_PROVIDER_SITE_OTHER): Payer: Medicaid Other | Admitting: Women's Health

## 2020-08-15 ENCOUNTER — Other Ambulatory Visit: Payer: Medicaid Other

## 2020-08-15 VITALS — BP 121/73 | HR 72 | Wt 131.0 lb

## 2020-08-15 DIAGNOSIS — Z3402 Encounter for supervision of normal first pregnancy, second trimester: Secondary | ICD-10-CM

## 2020-08-15 DIAGNOSIS — Z3A27 27 weeks gestation of pregnancy: Secondary | ICD-10-CM

## 2020-08-15 DIAGNOSIS — Z131 Encounter for screening for diabetes mellitus: Secondary | ICD-10-CM

## 2020-08-15 DIAGNOSIS — Z124 Encounter for screening for malignant neoplasm of cervix: Secondary | ICD-10-CM | POA: Diagnosis present

## 2020-08-15 DIAGNOSIS — A749 Chlamydial infection, unspecified: Secondary | ICD-10-CM

## 2020-08-15 DIAGNOSIS — D573 Sickle-cell trait: Secondary | ICD-10-CM

## 2020-08-15 NOTE — Patient Instructions (Signed)
Sharon Webb, I greatly value your feedback.  If you receive a survey following your visit with [place] today, we appreciate you taking the time to fill it out.  Thanks, [name], CNM, WHNP-BC   [facility] ([address] [place], [place] [address]) Entrance C, located off of [address] valet parking  Go to [email or web address] to register for FREE online childbirth classes   Call the office [phone]) or go to [facility] if:  You begin to have strong, frequent contractions  Your water breaks.  Sometimes it is a big gush of fluid, sometimes it is just a trickle that keeps getting your panties wet or running down your legs  You have vaginal bleeding.  It is normal to have a small amount of spotting if your cervix was checked.   You don't feel your baby moving like normal.  If you don't, get you something to eat and drink and lay down and focus on feeling your baby move.  You should feel at least 10 movements in 2 hours.  If you don't, you should call the office or go to [facility].    Tdap Vaccine  It is recommended that you get the Tdap vaccine during the third trimester of EACH pregnancy to help protect your baby from getting pertussis (whooping cough)  27-36 weeks is the BEST time to do this so that you can pass the protection on to your baby. During pregnancy is better than after pregnancy, but if you are unable to get it during pregnancy it will be offered at the hospital.   You can get this vaccine with [place], at the health department, your family doctor, or some local pharmacies  Everyone who will be around your baby should also be up-to-date on their vaccines before the baby comes. Adults (who are not pregnant) only need 1 dose of Tdap during adulthood.   [place] Pediatricians/Family Doctors:  [facility] [phone]            [facility] [phone]                 [facility]  [phone] (usually not accepting new patients unless you have family there already, you are always welcome to call and ask)       [facility] [phone]       [facility] Pediatricians/Family Doctors:   [facility]: [phone]  [facility]: [phone]  [facility]: [phone]  [facility] Doctors:   [facility]: [phone]   [facility]: [phone]  [facility] Doctors:  [facility]: [phone]   Home Blood Pressure Monitoring for Patients   Your provider has recommended that you check your blood pressure (BP) at least once a week at home. If you do not have a blood pressure cuff at home, one will be provided for you. Contact your provider if you have not received your monitor within 1 week.   Helpful Tips for Accurate Home Blood Pressure Checks  . Don't smoke, exercise, or drink caffeine 30 minutes before checking your BP . Use the restroom before checking your BP (a full bladder can raise your pressure) . Relax in a comfortable upright chair . Feet on the ground . Left arm resting comfortably on a flat surface at the level of your heart . Legs uncrossed . Back supported . Sit quietly and don't talk . Place the cuff on your bare  arm . Adjust snuggly, so that only two fingertips can fit between your skin and the top of the cuff . Check 2 readings separated by at least one minute . Keep a log of your BP readings . For a visual, please reference this diagram: http://ccnc.care/bpdiagram  Provider Name: [facility]     Phone: [phone]  Zone 1: ALL CLEAR  Continue to monitor your symptoms:  . BP reading is less than 140 (top number) or less than 90 (bottom number)  . No right upper stomach pain . No headaches or seeing spots . No feeling nauseated or throwing up . No swelling in face and hands  Zone 2: CAUTION Call your  doctor's office for any of the following:  . BP reading is greater than 140 (top number) or greater than 90 (bottom number)  . Stomach pain under your ribs in the middle or right side . Headaches or seeing spots . Feeling nauseated or throwing up . Swelling in face and hands  Zone 3: EMERGENCY  Seek immediate medical care if you have any of the following:  . BP reading is greater than160 (top number) or greater than 110 (bottom number) . Severe headaches not improving with Tylenol . Serious difficulty catching your breath . Any worsening symptoms from Zone 2   Third Trimester of Pregnancy The third trimester is from week 29 through week 42, months 7 through 9. The third trimester is a time when the fetus is growing rapidly. At the end of the ninth month, the fetus is about 20 inches in length and weighs 6-10 pounds.  BODY CHANGES Your body goes through many changes during pregnancy. The changes vary from woman to woman.   Your weight will continue to increase. You can expect to gain 25-35 pounds (11-16 kg) by the end of the pregnancy.  You may begin to get stretch marks on your hips, abdomen, and breasts.  You may urinate more often because the fetus is moving lower into your pelvis and pressing on your bladder.  You may develop or continue to have heartburn as a result of your pregnancy.  You may develop constipation because certain hormones are causing the muscles that push waste through your intestines to slow down.  You may develop hemorrhoids or swollen, bulging veins (varicose veins).  You may have pelvic pain because of the weight gain and pregnancy hormones relaxing your joints between the bones in your pelvis. Backaches may result from overexertion of the muscles supporting your posture.  You may have changes in your hair. These can include thickening of your hair, rapid growth, and changes in texture. Some women also have hair loss during or after pregnancy, or hair that  feels dry or thin. Your hair will most likely return to normal after your baby is born.  Your breasts will continue to grow and be tender. A yellow discharge may leak from your breasts called colostrum.  Your belly button may stick out.  You may feel short of breath because of your expanding uterus.  You may notice the fetus "dropping," or moving lower in your abdomen.  You may have a bloody mucus discharge. This usually occurs a few days to a week before labor begins.  Your cervix becomes thin and soft (effaced) near your due date. WHAT TO EXPECT AT YOUR PRENATAL EXAMS  You will have prenatal exams every 2 weeks until week 36. Then, you will have weekly prenatal exams. During a routine prenatal visit:  You  will be weighed to make sure you and the fetus are growing normally.  Your blood pressure is taken.  Your abdomen will be measured to track your baby's growth.  The fetal heartbeat will be listened to.  Any test results from the previous visit will be discussed.  You may have a cervical check near your due date to see if you have effaced. At around 36 weeks, your caregiver will check your cervix. At the same time, your caregiver will also perform a test on the secretions of the vaginal tissue. This test is to determine if a type of bacteria, Group B streptococcus, is present. Your caregiver will explain this further. Your caregiver may ask you:  What your birth plan is.  How you are feeling.  If you are feeling the baby move.  If you have had any abnormal symptoms, such as leaking fluid, bleeding, severe headaches, or abdominal cramping.  If you have any questions. Other tests or screenings that may be performed during your third trimester include:  Blood tests that check for low iron levels (anemia).  Fetal testing to check the health, activity level, and growth of the fetus. Testing is done if you have certain medical conditions or if there are problems during the  pregnancy. FALSE LABOR You may feel small, irregular contractions that eventually go away. These are called Braxton Hicks contractions, or false labor. Contractions may last for hours, days, or even weeks before true labor sets in. If contractions come at regular intervals, intensify, or become painful, it is best to be seen by your caregiver.  SIGNS OF LABOR   Menstrual-like cramps.  Contractions that are 5 minutes apart or less.  Contractions that start on the top of the uterus and spread down to the lower abdomen and back.  A sense of increased pelvic pressure or back pain.  A watery or bloody mucus discharge that comes from the vagina. If you have any of these signs before the 37th week of pregnancy, call your caregiver right away. You need to go to the hospital to get checked immediately. HOME CARE INSTRUCTIONS   Avoid all smoking, herbs, alcohol, and unprescribed drugs. These chemicals affect the formation and growth of the baby.  Follow your caregiver's instructions regarding medicine use. There are medicines that are either safe or unsafe to take during pregnancy.  Exercise only as directed by your caregiver. Experiencing uterine cramps is a good sign to stop exercising.  Continue to eat regular, healthy meals.  Wear a good support bra for breast tenderness.  Do not use hot tubs, steam rooms, or saunas.  Wear your seat belt at all times when driving.  Avoid raw meat, uncooked cheese, cat litter boxes, and soil used by cats. These carry germs that can cause birth defects in the baby.  Take your prenatal vitamins.  Try taking a stool softener (if your caregiver approves) if you develop constipation. Eat more high-fiber foods, such as fresh vegetables or fruit and whole grains. Drink plenty of fluids to keep your urine clear or pale yellow.  Take warm sitz baths to soothe any pain or discomfort caused by hemorrhoids. Use hemorrhoid cream if your caregiver approves.  If you  develop varicose veins, wear support hose. Elevate your feet for 15 minutes, 3-4 times a day. Limit salt in your diet.  Avoid heavy lifting, wear low heal shoes, and practice good posture.  Rest a lot with your legs elevated if you have leg cramps or low back  pain.  Visit your dentist if you have not gone during your pregnancy. Use a soft toothbrush to brush your teeth and be gentle when you floss.  A sexual relationship may be continued unless your caregiver directs you otherwise.  Do not travel far distances unless it is absolutely necessary and only with the approval of your caregiver.  Take prenatal classes to understand, practice, and ask questions about the labor and delivery.  Make a trial run to the hospital.  Pack your hospital bag.  Prepare the baby's nursery.  Continue to go to all your prenatal visits as directed by your caregiver. SEEK MEDICAL CARE IF:  You are unsure if you are in labor or if your water has broken.  You have dizziness.  You have mild pelvic cramps, pelvic pressure, or nagging pain in your abdominal area.  You have persistent nausea, vomiting, or diarrhea.  You have a bad smelling vaginal discharge.  You have pain with urination. SEEK IMMEDIATE MEDICAL CARE IF:   You have a fever.  You are leaking fluid from your vagina.  You have spotting or bleeding from your vagina.  You have severe abdominal cramping or pain.  You have rapid weight loss or gain.  You have shortness of breath with chest pain.  You notice sudden or extreme swelling of your face, hands, ankles, feet, or legs.  You have not felt your baby move in over an hour.  You have severe headaches that do not go away with medicine.  You have vision changes. Document Released: 03/06/2001 Document Revised: 03/17/2013 Document Reviewed: 05/13/2012 [facility] Patient Information 2015 [place], [place]. This information is not intended to replace advice given to you by your health  care provider. Make sure you discuss any questions you have with your health care provider.

## 2020-08-15 NOTE — Progress Notes (Signed)
   LOW-RISK PREGNANCY VISIT Patient name: Sharon Webb MRN [id number]  Date of birth: 05-04-2000 Chief Complaint:   Routine Prenatal Visit (PN2)  History of Present Illness:   Sharon Webb is a 20 y.o. G1P0 female at [redacted]w[redacted]d with an Estimated Date of Delivery: 11/11/20 being seen today for ongoing management of a low-risk pregnancy.  Depression screen [facility] 2/9 08/15/2020 05/11/2020 04/19/2020 04/19/2020  Decreased Interest 0 0 1 1  Down, Depressed, Hopeless 0 0 0 0  PHQ - 2 Score 0 0 1 1  Altered sleeping 0 0 1 1  Tired, decreased energy 1 0 1 1  Change in appetite 0 0 1 1  Feeling bad or failure about yourself  0 0 0 0  Trouble concentrating 0 0 0 0  Moving slowly or fidgety/restless 0 0 0 0  Suicidal thoughts 0 0 0 0  PHQ-9 Score 1 0 4 4    Today she reports no complaints. Contractions: Not present. Vag. Bleeding: None.  Movement: Present. denies leaking of fluid. Review of Systems:   Pertinent items are noted in HPI Denies abnormal vaginal discharge w/ itching/odor/irritation, headaches, visual changes, shortness of breath, chest pain, abdominal pain, severe nausea/vomiting, or problems with urination or bowel movements unless otherwise stated above. Pertinent History Reviewed:  Reviewed past medical,surgical, social, obstetrical and family history.  Reviewed problem list, medications and allergies. Physical Assessment:   Vitals:   08/15/20 0912  BP: 121/73  Pulse: 72  Weight: 131 lb (59.4 kg)  Body mass index is 20.52 kg/m.        Physical Examination:   General appearance: Well appearing, and in no distress  Mental status: Alert, oriented to person, place, and time  Skin: Warm & dry  Cardiovascular: Normal heart rate noted  Respiratory: Normal respiratory effort, no distress  Abdomen: Soft, gravid, nontender  Pelvic: thin prep pap obtained         Extremities: Edema: None  Fetal Status: Fetal Heart Rate (bpm): 135 Fundal Height: 25 cm Movement: Present    Chaperone:  Latisha Cresenzo   No results found for this or any previous visit (from the past 24 hour(s)).  Assessment & Plan:  1) Low-risk pregnancy G1P0 at [redacted]w[redacted]d with an Estimated Date of Delivery: 11/11/20   2) +CT, POC today  3) +SCT> urine cx today   Meds: No orders of the defined types were placed in this encounter.  Labs/procedures today: spec exam, pap, PN2 and declined tdap  Plan:  Continue routine obstetrical care  Next visit: prefers will be in person for rhogam/tdap    Reviewed: Preterm labor symptoms and general obstetric precautions including but not limited to vaginal bleeding, contractions, leaking of fluid and fetal movement were reviewed in detail with the patient.  All questions were answered. Does have home bp cuff. Office bp cuff given: not applicable. Check bp weekly, let us know if consistently >140 and/or >90.  Follow-up: Return in about 4 weeks (around 09/12/2020) for LROB, CNM.  No future appointments.  Orders Placed This Encounter  Procedures  . Urine Culture   Cheral Marker CNM, [facility] 08/15/2020 9:37 AM

## 2020-08-16 LAB — CYTOLOGY - PAP
Chlamydia: NEGATIVE
Comment: NEGATIVE
Comment: NORMAL
Diagnosis: NEGATIVE
Neisseria Gonorrhea: NEGATIVE

## 2020-08-16 LAB — GLUCOSE TOLERANCE, 2 HOURS W/ 1HR
Glucose, 1 hour: 101 mg/dL (ref 65–179)
Glucose, 2 hour: 59 mg/dL — ABNORMAL LOW (ref 65–152)
Glucose, Fasting: 66 mg/dL (ref 65–91)

## 2020-08-16 LAB — CBC
Hematocrit: 34.1 % (ref 34.0–46.6)
Hemoglobin: 11.5 g/dL (ref 11.1–15.9)
MCH: 29.6 pg (ref 26.6–33.0)
MCHC: 33.7 g/dL (ref 31.5–35.7)
MCV: 88 fL (ref 79–97)
Platelets: 224 10*3/uL (ref 150–450)
RBC: 3.88 x10E6/uL (ref 3.77–5.28)
RDW: 13.2 % (ref 11.7–15.4)
WBC: 4.4 10*3/uL (ref 3.4–10.8)

## 2020-08-16 LAB — HIV ANTIBODY (ROUTINE TESTING W REFLEX): HIV Screen 4th Generation wRfx: NONREACTIVE

## 2020-08-16 LAB — ANTIBODY SCREEN: Antibody Screen: NEGATIVE

## 2020-08-16 LAB — RPR: RPR Ser Ql: NONREACTIVE

## 2020-08-17 LAB — URINE CULTURE

## 2020-09-12 ENCOUNTER — Encounter: Payer: Self-pay | Admitting: Women's Health

## 2020-09-12 ENCOUNTER — Other Ambulatory Visit: Payer: Self-pay

## 2020-09-12 ENCOUNTER — Ambulatory Visit (INDEPENDENT_AMBULATORY_CARE_PROVIDER_SITE_OTHER): Payer: Medicaid Other | Admitting: Women's Health

## 2020-09-12 VITALS — BP 115/69 | HR 60 | Wt 134.0 lb

## 2020-09-12 DIAGNOSIS — Z23 Encounter for immunization: Secondary | ICD-10-CM

## 2020-09-12 DIAGNOSIS — O26893 Other specified pregnancy related conditions, third trimester: Secondary | ICD-10-CM

## 2020-09-12 DIAGNOSIS — Z6791 Unspecified blood type, Rh negative: Secondary | ICD-10-CM | POA: Diagnosis not present

## 2020-09-12 DIAGNOSIS — Z3403 Encounter for supervision of normal first pregnancy, third trimester: Secondary | ICD-10-CM | POA: Diagnosis not present

## 2020-09-12 DIAGNOSIS — Z3A31 31 weeks gestation of pregnancy: Secondary | ICD-10-CM | POA: Diagnosis not present

## 2020-09-12 NOTE — Progress Notes (Signed)
LOW-RISK PREGNANCY VISIT Patient name: Sharon Webb MRN [id number]  Date of birth: [birth year] Chief Complaint:   Routine Prenatal Visit (Rhogam and Tdap today)  History of Present Illness:   Sharon Webb is a 20 y.o. G1P0 female at [redacted]w[redacted]d with an Estimated Date of Delivery: 11/11/20 being seen today for ongoing management of a low-risk pregnancy.   Today she reports no complaints. Contractions: Not present. Vag. Bleeding: None.  Movement: Present. denies leaking of fluid.  Depression screen [facility] 2/9 08/15/2020 05/11/2020 04/19/2020 04/19/2020  Decreased Interest 0 0 1 1  Down, Depressed, Hopeless 0 0 0 0  PHQ - 2 Score 0 0 1 1  Altered sleeping 0 0 1 1  Tired, decreased energy 1 0 1 1  Change in appetite 0 0 1 1  Feeling bad or failure about yourself  0 0 0 0  Trouble concentrating 0 0 0 0  Moving slowly or fidgety/restless 0 0 0 0  Suicidal thoughts 0 0 0 0  PHQ-9 Score 1 0 4 4     GAD 7 : Generalized Anxiety Score 08/15/2020 05/11/2020 04/19/2020  Nervous, Anxious, on Edge 0 0 0  Control/stop worrying 0 0 0  Worry too much - different things 0 0 1  Trouble relaxing 0 0 0  Restless 0 0 0  Easily annoyed or irritable 0 0 1  Afraid - awful might happen 0 0 0  Total GAD 7 Score 0 0 2      Review of Systems:   Pertinent items are noted in HPI Denies abnormal vaginal discharge w/ itching/odor/irritation, headaches, visual changes, shortness of breath, chest pain, abdominal pain, severe nausea/vomiting, or problems with urination or bowel movements unless otherwise stated above. Pertinent History Reviewed:  Reviewed past medical,surgical, social, obstetrical and family history.  Reviewed problem list, medications and allergies. Physical Assessment:   Vitals:   09/12/20 0910  BP: 115/69  Pulse: 60  Weight: 134 lb (60.8 kg)  Body mass index is 20.99 kg/m.        Physical Examination:   General appearance: Well appearing, and in no distress  Mental status: Alert, oriented to  person, place, and time  Skin: Warm & dry  Cardiovascular: Normal heart rate noted  Respiratory: Normal respiratory effort, no distress  Abdomen: Soft, gravid, nontender  Pelvic: Cervical exam deferred         Extremities: Edema: None  Fetal Status: Fetal Heart Rate (bpm): 147 Fundal Height: 29 cm Movement: Present    Chaperone: N/A   No results found for this or any previous visit (from the past 24 hour(s)).  Assessment & Plan:  1) Low-risk pregnancy G1P0 at [redacted]w[redacted]d with an Estimated Date of Delivery: 11/11/20   2) SCT   Meds: No orders of the defined types were placed in this encounter.  Labs/procedures today: tdap and Rhogam  Plan:  Continue routine obstetrical care  Next visit: prefers in person    Reviewed: Preterm labor symptoms and general obstetric precautions including but not limited to vaginal bleeding, contractions, leaking of fluid and fetal movement were reviewed in detail with the patient.  All questions were answered. Does have home bp cuff. Office bp cuff given: not applicable. Check bp weekly, let us know if consistently >140 and/or >90.  Follow-up: Return in about 2 weeks (around 09/26/2020) for LROB, CNM, in person.  No future appointments.  Orders Placed This Encounter  Procedures   Tdap vaccine greater than or equal to 7yo IM  RHO (D) Immune Globulin   Cheral Marker CNM, [facility] 09/12/2020 9:30 AM

## 2020-09-29 ENCOUNTER — Ambulatory Visit (INDEPENDENT_AMBULATORY_CARE_PROVIDER_SITE_OTHER): Payer: Medicaid Other | Admitting: Women's Health

## 2020-09-29 ENCOUNTER — Encounter: Payer: Self-pay | Admitting: Women's Health

## 2020-09-29 ENCOUNTER — Other Ambulatory Visit (INDEPENDENT_AMBULATORY_CARE_PROVIDER_SITE_OTHER): Payer: Medicaid Other

## 2020-09-29 ENCOUNTER — Other Ambulatory Visit: Payer: Self-pay

## 2020-09-29 VITALS — BP 125/76 | HR 76 | Wt 139.0 lb

## 2020-09-29 DIAGNOSIS — O26843 Uterine size-date discrepancy, third trimester: Secondary | ICD-10-CM | POA: Diagnosis not present

## 2020-09-29 DIAGNOSIS — O26899 Other specified pregnancy related conditions, unspecified trimester: Secondary | ICD-10-CM

## 2020-09-29 DIAGNOSIS — Z3483 Encounter for supervision of other normal pregnancy, third trimester: Secondary | ICD-10-CM | POA: Diagnosis not present

## 2020-09-29 DIAGNOSIS — Z3A33 33 weeks gestation of pregnancy: Secondary | ICD-10-CM

## 2020-09-29 DIAGNOSIS — Z3402 Encounter for supervision of normal first pregnancy, second trimester: Secondary | ICD-10-CM

## 2020-09-29 NOTE — Patient Instructions (Addendum)
Sharon Webb, thank you for choosing our office today! We appreciate the opportunity to meet your healthcare needs. You may receive a short survey by mail, e-mail, or through [employer]. If you are happy with your care we would appreciate if you could take just a few minutes to complete the survey questions. We read all of your comments and take your feedback very seriously. Thank you again for choosing our office.  Center for [employer] Team at [facility]  [facility] at [facility] ([address] [place], [place] [address]) Entrance C, located off of [address] Free 24/7 valet parking   CLASSES: Go to [email or web address] to register for classes (childbirth, breastfeeding, waterbirth, infant CPR, daddy bootcamp, etc.)  Tips to Help Leg Cramps Increase dietary sources of calcium (milk, yogurt, cheese, leafy greens, seafood, legumes, and fruit) and magnesium (dark leafy greens, nuts, seeds, fish, beans, whole grains, avocados, yogurt, bananas, dried fruit, dark chocolate) Spoonful of regular yellow mustard every night Pickle juice Magnesium supplement: in the morning, at night (can find in the vitamin aisle) Thiamine (Vit B1) 100mg  and pyridoxine (Vit B6) 40mg  daily for 2 weeks Dorsiflexion of foot: pointing your toes back towards your knee during the cramp      Call the office [phone]) or go to [facility] if: You begin to have strong, frequent contractions Your water breaks.  Sometimes it is a big gush of fluid, sometimes it is just a trickle that keeps getting your panties wet or running down your legs You have vaginal bleeding.  It is normal to have a small amount of spotting if your cervix was checked.  You don't feel your baby moving like normal.  If you don't, get you something to eat and drink and lay down and focus on feeling your baby move.   If your baby is still not moving like normal, you should call the office or go to [facility].  Call  the office [phone]) or go to [facility] hospital for these signs of pre-eclampsia: Severe headache that does not go away with Tylenol Visual changes- seeing spots, double, blurred vision Pain under your right breast or upper abdomen that does not go away with Tums or heartburn medicine Nausea and/or vomiting Severe swelling in your hands, feet, and face   Tdap Vaccine It is recommended that you get the Tdap vaccine during the third trimester of EACH pregnancy to help protect your baby from getting pertussis (whooping cough) 27-36 weeks is the BEST time to do this so that you can pass the protection on to your baby. During pregnancy is better than after pregnancy, but if you are unable to get it during pregnancy it will be offered at the hospital.  You can get this vaccine with ([phone], at the health department, your family doctor, or some local pharmacies Everyone who will be around your baby should also be up-to-date on their vaccines before the baby comes. Adults (who are not pregnant) only need 1 dose of Tdap during adulthood.   [facility] Pediatricians/Family Doctors [facility]): [address] [facility], [phone]           [facility]: [address], [phone]                [facility]): [address], [phone] (call to ask if accepting patients) [facility]: [address], [place], [address]    [facility] Pediatricians/Family Doctors  [facility]): [address], [phone] [facility]: [address] [place], [phone] [facility]: [address], [phone]  [facility] Doctors  [facility]): [phone] [facility]: [address] [phone]   [facility] Doctors [facility]: [address] [phone]  [facility] Doctors   [facility]: [phone], [phone]  Home Blood Pressure Monitoring for Patients   Your provider has recommended that you check your blood pressure (BP) at least once a week at home. If you do not have a blood pressure cuff at home, one will be provided for you. Contact your provider if you have not received your monitor within 1 week.   Helpful Tips for Accurate Home Blood Pressure Checks  Don't smoke, exercise, or drink caffeine 30 minutes before checking your BP Use the restroom before checking your BP (a full bladder can raise your pressure) Relax in a comfortable upright chair Feet on the ground Left arm resting comfortably on a flat surface at the level of your heart Legs uncrossed Back supported Sit quietly and don't talk Place the cuff on your bare arm Adjust snuggly, so that only two fingertips can fit between your skin and the top of the cuff Check 2 readings separated by at least one minute Keep a log of your BP readings For a visual, please reference this diagram: http://ccnc.care/bpdiagram  Provider Name: [facility]     Phone: [phone]  Zone 1: ALL CLEAR  Continue to monitor your symptoms:  BP reading is less than 140 (top number) or less than 90 (bottom number)  No right upper stomach pain No headaches or seeing spots No feeling nauseated or throwing up No swelling in face and hands  Zone 2: CAUTION Call your doctor's office for any of the following:  BP reading is greater than 140 (top number) or greater than 90 (bottom number)  Stomach pain under your ribs in the middle or right side Headaches or seeing spots Feeling nauseated or throwing up Swelling in face and hands  Zone 3: EMERGENCY  Seek immediate medical care if you have any of the following:  BP reading is greater than160 (top number) or greater than 110 (bottom number) Severe headaches not improving with Tylenol Serious difficulty catching your breath Any worsening  symptoms from Zone 2  Preterm Labor and Birth Information  The normal length of a pregnancy is 39-41 weeks. Preterm labor is when labor starts before 37 completed weeks of pregnancy. What are the risk factors for preterm labor? Preterm labor is more likely to occur in women who: Have certain infections during pregnancy such as a bladder infection, sexually transmitted infection, or infection inside the uterus (chorioamnionitis). Have a shorter-than-normal cervix. Have gone into preterm labor before. Have had surgery on their cervix. Are younger than age 5 or older than age 8. Are African American. Are pregnant with twins or multiple babies (multiple gestation). Take street drugs or smoke while pregnant. Do not gain enough weight while pregnant. Became pregnant shortly after having been pregnant. What are the symptoms of preterm labor? Symptoms of preterm labor include: Cramps similar to those that can happen during a menstrual period. The cramps may happen with diarrhea. Pain in the abdomen or lower back. Regular uterine contractions that may feel like tightening of the abdomen. A feeling of increased pressure in the pelvis. Increased watery or bloody mucus discharge  from the vagina. Water breaking (ruptured amniotic sac). Why is it important to recognize signs of preterm labor? It is important to recognize signs of preterm labor because babies who are born prematurely may not be fully developed. This can put them at an increased risk for: Long-term (chronic) heart and lung problems. Difficulty immediately after birth with regulating body systems, including blood sugar, body temperature, heart rate, and breathing rate. Bleeding in the brain. Cerebral palsy. Learning difficulties. Death. These risks are highest for babies who are born before 34 weeks of pregnancy. How is preterm labor treated? Treatment depends on the length of your pregnancy, your condition, and the health of your  baby. It may involve: Having a stitch (suture) placed in your cervix to prevent your cervix from opening too early (cerclage). Taking or being given medicines, such as: Hormone medicines. These may be given early in pregnancy to help support the pregnancy. Medicine to stop contractions. Medicines to help mature the baby's lungs. These may be prescribed if the risk of delivery is high. Medicines to prevent your baby from developing cerebral palsy. If the labor happens before 34 weeks of pregnancy, you may need to stay in the hospital. What should I do if I think I am in preterm labor? If you think that you are going into preterm labor, call your health care provider right away. How can I prevent preterm labor in future pregnancies? To increase your chance of having a full-term pregnancy: Do not use any tobacco products, such as cigarettes, chewing tobacco, and e-cigarettes. If you need help quitting, ask your health care provider. Do not use street drugs or medicines that have not been prescribed to you during your pregnancy. Talk with your health care provider before taking any herbal supplements, even if you have been taking them regularly. Make sure you gain a healthy amount of weight during your pregnancy. Watch for infection. If you think that you might have an infection, get it checked right away. Make sure to tell your health care provider if you have gone into preterm labor before. This information is not intended to replace advice given to you by your health care provider. Make sure you discuss any questions you have with your health care provider. Document Revised: 07/04/2018 Document Reviewed: 08/03/2015 Elsevier Patient Education  2020 [employer].

## 2020-09-29 NOTE — Progress Notes (Signed)
LOW-RISK PREGNANCY VISIT Patient name: Sharon Webb MRN [id number]  Date of birth: 05/28/2000 Chief Complaint:   Routine Prenatal Visit  History of Present Illness:   Sharon Webb is a 20 y.o. G1P0 female at [redacted]w[redacted]d with an Estimated Date of Delivery: 11/11/20 being seen today for ongoing management of a low-risk pregnancy.   Today she reports leg cramps. Contractions: Irregular. Vag. Bleeding: None.  Movement: Present. denies leaking of fluid.  Depression screen [facility] 2/9 08/15/2020 05/11/2020 04/19/2020 04/19/2020  Decreased Interest 0 0 1 1  Down, Depressed, Hopeless 0 0 0 0  PHQ - 2 Score 0 0 1 1  Altered sleeping 0 0 1 1  Tired, decreased energy 1 0 1 1  Change in appetite 0 0 1 1  Feeling bad or failure about yourself  0 0 0 0  Trouble concentrating 0 0 0 0  Moving slowly or fidgety/restless 0 0 0 0  Suicidal thoughts 0 0 0 0  PHQ-9 Score 1 0 4 4     GAD 7 : Generalized Anxiety Score 08/15/2020 05/11/2020 04/19/2020  Nervous, Anxious, on Edge 0 0 0  Control/stop worrying 0 0 0  Worry too much - different things 0 0 1  Trouble relaxing 0 0 0  Restless 0 0 0  Easily annoyed or irritable 0 0 1  Afraid - awful might happen 0 0 0  Total GAD 7 Score 0 0 2      Review of Systems:   Pertinent items are noted in HPI Denies abnormal vaginal discharge w/ itching/odor/irritation, headaches, visual changes, shortness of breath, chest pain, abdominal pain, severe nausea/vomiting, or problems with urination or bowel movements unless otherwise stated above. Pertinent History Reviewed:  Reviewed past medical,surgical, social, obstetrical and family history.  Reviewed problem list, medications and allergies. Physical Assessment:   Vitals:   09/29/20 0922  BP: 125/76  Pulse: 76  Weight: 139 lb (63 kg)  Body mass index is 21.77 kg/m.        Physical Examination:   General appearance: Well appearing, and in no distress  Mental status: Alert, oriented to person, place, and  time  Skin: Warm & dry  Cardiovascular: Normal heart rate noted  Respiratory: Normal respiratory effort, no distress  Abdomen: Soft, gravid, nontender  Pelvic: Cervical exam deferred         Extremities: Edema: None  Fetal Status: Fetal Heart Rate (bpm): 146 Fundal Height: 27 cm Movement: Present    Chaperone: N/A   No results found for this or any previous visit (from the past 24 hour(s)).  Assessment & Plan:  1) Low-risk pregnancy G1P0 at [redacted]w[redacted]d with an Estimated Date of Delivery: 11/11/20   2) Uterine size <dates, will get efw/afi u/s (10:30 slot available today)   Meds: No orders of the defined types were placed in this encounter.  Labs/procedures today: U/S  Plan:  Continue routine obstetrical care  Next visit: prefers in person    Reviewed: Preterm labor symptoms and general obstetric precautions including but not limited to vaginal bleeding, contractions, leaking of fluid and fetal movement were reviewed in detail with the patient.  All questions were answered. Does have home bp cuff. Office bp cuff given: not applicable. Check bp weekly, let us know if consistently >140 and/or >90.  Follow-up: Return in about 2 weeks (around 10/13/2020) for LROB, CNM, in person.  Future Appointments  Date Time Provider Department Center  10/13/2020  4:10 PM Cheral Marker, CNM [facility]  Orders Placed This Encounter  Procedures   US OB Follow Up    Cheral Marker CNM, [facility] 09/29/2020 11:41 AM    Work-in u/s for s<d: [place] 33+6 wks,cephalic,posterior placenta gr 1,fhr 144 bpm,AFI 13 cm,2 LVEICF N/C,efw 2407 g 58% Cheral Marker, CNM, [facility]NP-BC 09/29/2020 11:41 AM

## 2020-09-29 NOTE — Progress Notes (Signed)
[place] 33+6 wks,cephalic,posterior placenta gr 1,fhr 144 bpm,AFI 13 cm,2 LVEICF N/C,efw 2407 g 58%

## 2020-10-13 ENCOUNTER — Encounter: Payer: Medicaid Other | Admitting: Women's Health

## 2020-10-31 ENCOUNTER — Ambulatory Visit (INDEPENDENT_AMBULATORY_CARE_PROVIDER_SITE_OTHER): Payer: Medicaid Other | Admitting: Obstetrics & Gynecology

## 2020-10-31 ENCOUNTER — Other Ambulatory Visit (HOSPITAL_COMMUNITY)
Admission: RE | Admit: 2020-10-31 | Discharge: 2020-10-31 | Disposition: A | Discharge status: Home / Self Care | Payer: Medicaid Other | Source: Ambulatory Visit | Attending: Obstetrics & Gynecology | Admitting: Obstetrics & Gynecology

## 2020-10-31 ENCOUNTER — Other Ambulatory Visit: Payer: Self-pay

## 2020-10-31 VITALS — BP 125/85 | HR 65 | Wt 149.0 lb

## 2020-10-31 DIAGNOSIS — Z3A38 38 weeks gestation of pregnancy: Secondary | ICD-10-CM | POA: Diagnosis not present

## 2020-10-31 DIAGNOSIS — Z3403 Encounter for supervision of normal first pregnancy, third trimester: Secondary | ICD-10-CM | POA: Insufficient documentation

## 2020-10-31 NOTE — Progress Notes (Signed)
   LOW-RISK PREGNANCY VISIT Patient name: Sharon Webb MRN [id number]  Date of birth: July 10, 2000 Chief Complaint:   Routine Prenatal Visit  History of Present Illness:   Sharon Webb is a 20 y.o. G1P0 female at [redacted]w[redacted]d with an Estimated Date of Delivery: 11/11/20 being seen today for ongoing management of a low-risk pregnancy.  Depression screen [facility] 2/9 08/15/2020 05/11/2020 04/19/2020 04/19/2020  Decreased Interest 0 0 1 1  Down, Depressed, Hopeless 0 0 0 0  PHQ - 2 Score 0 0 1 1  Altered sleeping 0 0 1 1  Tired, decreased energy 1 0 1 1  Change in appetite 0 0 1 1  Feeling bad or failure about yourself  0 0 0 0  Trouble concentrating 0 0 0 0  Moving slowly or fidgety/restless 0 0 0 0  Suicidal thoughts 0 0 0 0  PHQ-9 Score 1 0 4 4    Today she reports no complaints. Contractions: Irregular. Vag. Bleeding: None.  Movement: Present. denies leaking of fluid. Review of Systems:   Pertinent items are noted in HPI Denies abnormal vaginal discharge w/ itching/odor/irritation, headaches, visual changes, shortness of breath, chest pain, abdominal pain, severe nausea/vomiting, or problems with urination or bowel movements unless otherwise stated above. Pertinent History Reviewed:  Reviewed past medical,surgical, social, obstetrical and family history.  Reviewed problem list, medications and allergies. Physical Assessment:   Vitals:   10/31/20 1017  BP: 125/85  Pulse: 65  Weight: 149 lb (67.6 kg)  Body mass index is 23.34 kg/m.        Physical Examination:   General appearance: Well appearing, and in no distress  Mental status: Alert, oriented to person, place, and time  Skin: Warm & dry  Cardiovascular: Normal heart rate noted  Respiratory: Normal respiratory effort, no distress  Abdomen: Soft, gravid, nontender  Pelvic: Cervical exam performed  Dilation: 1.5 Effacement (%): Thick Station: -3  Extremities: Edema: None  Fetal Status: Fetal Heart Rate (bpm): 132 Fundal Height: 35 cm  Movement: Present Presentation: Vertex  Chaperone: Amanda Rash    No results found for this or any previous visit (from the past 24 hour(s)).  Assessment & Plan:  1) Low-risk pregnancy G1P0 at [redacted]w[redacted]d with an Estimated Date of Delivery: 11/11/20   2) cultures done,    Meds: No orders of the defined types were placed in this encounter.  Labs/procedures today: pending  Plan:  Continue routine obstetrical care  Next visit: prefers in person    Reviewed: Term labor symptoms and general obstetric precautions including but not limited to vaginal bleeding, contractions, leaking of fluid and fetal movement were reviewed in detail with the patient.  All questions were answered. Has home bp cuff. Rx faxed to  Check bp weekly, let us know if >140/90.   Follow-up: Return in about 1 week (around 11/07/2020) for LROB.  Orders Placed This Encounter  Procedures   Culture, beta strep (group b only)    Lazaro Arms, MD 10/31/2020 10:48 AM

## 2020-11-01 LAB — CERVICOVAGINAL ANCILLARY ONLY
Chlamydia: NEGATIVE
Comment: NEGATIVE
Comment: NORMAL
Neisseria Gonorrhea: NEGATIVE

## 2020-11-04 LAB — CULTURE, BETA STREP (GROUP B ONLY): Strep Gp B Culture: NEGATIVE

## 2020-11-07 DIAGNOSIS — Z029 Encounter for administrative examinations, unspecified: Secondary | ICD-10-CM

## 2020-11-08 ENCOUNTER — Encounter: Payer: Self-pay | Admitting: Women's Health

## 2020-11-08 ENCOUNTER — Other Ambulatory Visit: Payer: Self-pay

## 2020-11-08 ENCOUNTER — Ambulatory Visit (INDEPENDENT_AMBULATORY_CARE_PROVIDER_SITE_OTHER): Payer: Medicaid Other | Admitting: Women's Health

## 2020-11-08 VITALS — BP 128/83 | HR 56 | Wt 151.0 lb

## 2020-11-08 DIAGNOSIS — O26893 Other specified pregnancy related conditions, third trimester: Secondary | ICD-10-CM

## 2020-11-08 DIAGNOSIS — N898 Other specified noninflammatory disorders of vagina: Secondary | ICD-10-CM | POA: Diagnosis not present

## 2020-11-08 DIAGNOSIS — Z3403 Encounter for supervision of normal first pregnancy, third trimester: Secondary | ICD-10-CM | POA: Diagnosis not present

## 2020-11-08 DIAGNOSIS — Z3A39 39 weeks gestation of pregnancy: Secondary | ICD-10-CM

## 2020-11-08 LAB — POCT WET PREP (WET MOUNT)
Clue Cells Wet Prep Whiff POC: NEGATIVE
Trichomonas Wet Prep HPF POC: ABSENT

## 2020-11-08 NOTE — Progress Notes (Signed)
LOW-RISK PREGNANCY VISIT Patient name: Sharon Webb MRN [id number]  Date of birth: [birth year] Chief Complaint:   Routine Prenatal Visit  History of Present Illness:   Sharon Webb is a 20 y.o. G1P0 female at [redacted]w[redacted]d with an Estimated Date of Delivery: 11/11/20 being seen today for ongoing management of a low-risk pregnancy.   Today she reports no complaints. Contractions: Irregular. Vag. Bleeding: None.  Movement: Present. denies leaking of fluid.  Depression screen [facility] 2/9 08/15/2020 05/11/2020 04/19/2020 04/19/2020  Decreased Interest 0 0 1 1  Down, Depressed, Hopeless 0 0 0 0  PHQ - 2 Score 0 0 1 1  Altered sleeping 0 0 1 1  Tired, decreased energy 1 0 1 1  Change in appetite 0 0 1 1  Feeling bad or failure about yourself  0 0 0 0  Trouble concentrating 0 0 0 0  Moving slowly or fidgety/restless 0 0 0 0  Suicidal thoughts 0 0 0 0  PHQ-9 Score 1 0 4 4     GAD 7 : Generalized Anxiety Score 08/15/2020 05/11/2020 04/19/2020  Nervous, Anxious, on Edge 0 0 0  Control/stop worrying 0 0 0  Worry too much - different things 0 0 1  Trouble relaxing 0 0 0  Restless 0 0 0  Easily annoyed or irritable 0 0 1  Afraid - awful might happen 0 0 0  Total GAD 7 Score 0 0 2      Review of Systems:   Pertinent items are noted in HPI Denies abnormal vaginal discharge w/ itching/odor/irritation, headaches, visual changes, shortness of breath, chest pain, abdominal pain, severe nausea/vomiting, or problems with urination or bowel movements unless otherwise stated above. Pertinent History Reviewed:  Reviewed past medical,surgical, social, obstetrical and family history.  Reviewed problem list, medications and allergies. Physical Assessment:   Vitals:   11/08/20 1516  BP: 128/83  Pulse: (!) 56  Weight: 151 lb (68.5 kg)  Body mass index is 23.65 kg/m.        Physical Examination:   General appearance: Well appearing, and in no distress  Mental status: Alert, oriented to person, place, and  time  Skin: Warm & dry  Cardiovascular: Normal heart rate noted  Respiratory: Normal respiratory effort, no distress  Abdomen: Soft, gravid, nontender  Pelvic:  large amt vaginal d/c on vulva, pt reports she has noticed increased d/c, no itching/odor/irritation. Sample collected from exam finger for wet prep   Dilation: 3 Effacement (%): 50 Station: -2  Extremities: Edema: None  Fetal Status: Fetal Heart Rate (bpm): 157 Fundal Height: 36 cm Movement: Present Presentation: Vertex  Chaperone: Faith Rogue   Results for orders placed or performed in visit on 11/08/20 (from the past 24 hour(s))  POCT Wet Prep [facility] [place])   Collection Time: 11/08/20  3:55 PM  Result Value Ref Range   Source Wet Prep POC vaginal    WBC, Wet Prep HPF POC few    Bacteria Wet Prep HPF POC Few Few   BACTERIA WET PREP MORPHOLOGY POC     Clue Cells Wet Prep HPF POC None None   Clue Cells Wet Prep Whiff POC Negative Whiff    Yeast Wet Prep HPF POC None None   KOH Wet Prep POC     Trichomonas Wet Prep HPF POC Absent Absent    Assessment & Plan:  1) Low-risk pregnancy G1P0 at [redacted]w[redacted]d with an Estimated Date of Delivery: 11/11/20   2) Vaginal d/c, wet prep neg, asymptomatic  Meds: No orders of the defined types were placed in this encounter.  Labs/procedures today: SVE and wet prep  Plan:  Continue routine obstetrical care  Next visit: prefers in person    Reviewed: Term labor symptoms and general obstetric precautions including but not limited to vaginal bleeding, contractions, leaking of fluid and fetal movement were reviewed in detail with the patient.  All questions were answered. Does have home bp cuff. Office bp cuff given: not applicable. Check bp weekly, let us know if consistently >140 and/or >90.  Follow-up: Return in about 6 days (around 11/14/2020) for LROB, US:BPP (NST if u/s not available) , CNM, in person.  Future Appointments  Date Time Provider Department Center  11/14/2020 10:10 AM Cheral Marker, CNM [facility]    Orders Placed This Encounter  Procedures   POCT Wet Prep [facility])    Cheral Marker [place], [facility] [place] [facility] 11/08/2020 3:56 PM

## 2020-11-08 NOTE — Patient Instructions (Signed)
Sharon Webb, thank you for choosing our office today! We appreciate the opportunity to meet your healthcare needs. You may receive a short survey by mail, e-mail, or through MyChart. If you are happy with your care we would appreciate if you could take just a few minutes to complete the survey questions. We read all of your comments and take your feedback very seriously. Thank you again for choosing our office.  [facility] Team at [facility] at  ([address] [place], [place] [address]) Entrance C, located off of [address] Free 24/7 valet parking   CLASSES: Go to [email or web address] to register for classes (childbirth, breastfeeding, waterbirth, infant CPR, daddy bootcamp, etc.)  Call the office ([phone]) or go to [facility] if: You begin to have strong, frequent contractions Your water breaks.  Sometimes it is a big gush of fluid, sometimes it is just a trickle that keeps getting your panties wet or running down your legs You have vaginal bleeding.  It is normal to have a small amount of spotting if your cervix was checked.  You don't feel your baby moving like normal.  If you don't, get you something to eat and drink and lay down and focus on feeling your baby move.   If your baby is still not moving like normal, you should call the office or go to [facility].  Call the office ([phone]) or go to [facility] for these signs of pre-eclampsia: Severe headache that does not go away with Tylenol Visual changes- seeing spots, double, blurred vision Pain under your right breast or upper abdomen that does not go away with Tums or heartburn medicine Nausea and/or vomiting Severe swelling in your hands, feet, and face   [place] Pediatricians/Family Doctors [facility] ([facility]): [address], [phone]           [facility]: [address], [phone]                 [facility] ([facility]): [address], [phone] (call to ask if accepting patients) [facility]: [address], [place], [phone]    [place] Pediatricians/Family Doctors [facility] ([facility]): [address], [phone] [facility]: [address], [phone] [facility]: [address], [phone]  [place] [facility]  [facility] ([facility]): [phone] [facility]: [address], [phone]   [place] [facility]: [address], [phone]  [place] [facility]  [facility]: [address], [phone]  Home Blood Pressure Monitoring for Patients   Your provider has recommended that you check your blood pressure (BP) at least once a week at home. If you do not have a blood pressure cuff at home, one will be provided for you. Contact your provider if you have not received your monitor within 1 week.   Helpful Tips for Accurate Home Blood Pressure Checks  Don't smoke, exercise, or drink caffeine 30 minutes before checking your BP Use the restroom before checking your BP (a full bladder can raise your pressure) Relax in a comfortable upright chair Feet on the ground Left arm resting comfortably on a flat surface at the level of your heart Legs uncrossed Back supported Sit quietly and don't talk Place the cuff on your bare arm Adjust snuggly, so that only two fingertips   can fit between your skin and the top of the cuff Check 2 readings separated by at least one minute Keep a log of your BP readings For a visual, please reference this diagram: http://ccnc.care/bpdiagram  Provider Name: [facility]     Phone: [phone]  Zone 1: ALL CLEAR  Continue to monitor your symptoms:  BP reading is less than 140 (top number) or less than 90 (bottom number)  No right  upper stomach pain No headaches or seeing spots No feeling nauseated or throwing up No swelling in face and hands  Zone 2: CAUTION Call your doctor's office for any of the following:  BP reading is greater than 140 (top number) or greater than 90 (bottom number)  Stomach pain under your ribs in the middle or right side Headaches or seeing spots Feeling nauseated or throwing up Swelling in face and hands  Zone 3: EMERGENCY  Seek immediate medical care if you have any of the following:  BP reading is greater than160 (top number) or greater than 110 (bottom number) Severe headaches not improving with Tylenol Serious difficulty catching your breath Any worsening symptoms from Zone 2   Braxton Hicks Contractions Contractions of the uterus can occur throughout pregnancy, but they are not always a sign that you are in labor. You may have practice contractions called Braxton Hicks contractions. These false labor contractions are sometimes confused with true labor. What are [place] contractions? Braxton Hicks contractions are tightening movements that occur in the muscles of the uterus before labor. Unlike true labor contractions, these contractions do not result in opening (dilation) and thinning of the cervix. Toward the end of pregnancy (32-34 weeks), Braxton Hicks contractions can happen more often and may become stronger. These contractions are sometimes difficult to tell apart from true labor because they can be very uncomfortable. You should not feel embarrassed if you go to the hospital with false labor. Sometimes, the only way to tell if you are in true labor is for your health care provider to look for changes in the cervix. The health care provider will do a physical exam and may monitor your contractions. If you are not in true labor, the exam should show that your cervix is not dilating and your water has not broken. If there are no other health problems associated with your  pregnancy, it is completely safe for you to be sent home with false labor. You may continue to have Braxton Hicks contractions until you go into true labor. How to tell the difference between true labor and false labor True labor Contractions last 30-70 seconds. Contractions become very regular. Discomfort is usually felt in the top of the uterus, and it spreads to the lower abdomen and low back. Contractions do not go away with walking. Contractions usually become more intense and increase in frequency. The cervix dilates and gets thinner. False labor Contractions are usually shorter and not as strong as true labor contractions. Contractions are usually irregular. Contractions are often felt in the front of the lower abdomen and in the groin. Contractions may go away when you walk around or change positions while lying down. Contractions get weaker and are shorter-lasting as time goes on. The cervix usually does not dilate or become thin. Follow these instructions at home:  Take over-the-counter and prescription medicines only as told by your health care provider. Keep up with your usual exercises and follow other instructions from your health care provider. Eat and drink lightly if you think  you are going into labor. If Braxton Hicks contractions are making you uncomfortable: Change your position from lying down or resting to walking, or change from walking to resting. Sit and rest in a tub of warm water. Drink enough fluid to keep your urine pale yellow. Dehydration may cause these contractions. Do slow and deep breathing several times an hour. Keep all follow-up prenatal visits as told by your health care provider. This is important. Contact a health care provider if: You have a fever. You have continuous pain in your abdomen. Get help right away if: Your contractions become stronger, more regular, and closer together. You have fluid leaking or gushing from your vagina. You pass  blood-tinged mucus (bloody show). You have bleeding from your vagina. You have low back pain that you never had before. You feel your baby's head pushing down and causing pelvic pressure. Your baby is not moving inside you as much as it used to. Summary Contractions that occur before labor are called Braxton Hicks contractions, false labor, or practice contractions. Braxton Hicks contractions are usually shorter, weaker, farther apart, and less regular than true labor contractions. True labor contractions usually become progressively stronger and regular, and they become more frequent. Manage discomfort from [facility] contractions by changing position, resting in a warm bath, drinking plenty of water, or practicing deep breathing. This information is not intended to replace advice given to you by your health care provider. Make sure you discuss any questions you have with your health care provider. Document Revised: 02/22/2017 Document Reviewed: 07/26/2016 Elsevier Patient Education  [place].

## 2020-11-09 ENCOUNTER — Inpatient Hospital Stay (HOSPITAL_COMMUNITY): Payer: Medicaid Other | Admitting: Anesthesiology

## 2020-11-09 ENCOUNTER — Encounter (HOSPITAL_COMMUNITY): Payer: Self-pay | Admitting: Obstetrics & Gynecology

## 2020-11-09 ENCOUNTER — Inpatient Hospital Stay (HOSPITAL_COMMUNITY)
Admission: AD | Admit: 2020-11-09 | Discharge: 2020-11-10 | DRG: 807 | Disposition: A | Discharge status: Home / Self Care | Payer: Medicaid Other | Attending: Family Medicine | Admitting: Family Medicine

## 2020-11-09 DIAGNOSIS — O26893 Other specified pregnancy related conditions, third trimester: Secondary | ICD-10-CM | POA: Diagnosis present

## 2020-11-09 DIAGNOSIS — Z6791 Unspecified blood type, Rh negative: Secondary | ICD-10-CM

## 2020-11-09 DIAGNOSIS — Z20822 Contact with and (suspected) exposure to covid-19: Secondary | ICD-10-CM | POA: Diagnosis present

## 2020-11-09 DIAGNOSIS — Z7982 Long term (current) use of aspirin: Secondary | ICD-10-CM

## 2020-11-09 DIAGNOSIS — O26899 Other specified pregnancy related conditions, unspecified trimester: Secondary | ICD-10-CM

## 2020-11-09 DIAGNOSIS — O1400 Mild to moderate pre-eclampsia, unspecified trimester: Secondary | ICD-10-CM | POA: Diagnosis not present

## 2020-11-09 DIAGNOSIS — D573 Sickle-cell trait: Secondary | ICD-10-CM | POA: Diagnosis present

## 2020-11-09 DIAGNOSIS — Z3A39 39 weeks gestation of pregnancy: Secondary | ICD-10-CM

## 2020-11-09 DIAGNOSIS — O9902 Anemia complicating childbirth: Secondary | ICD-10-CM | POA: Diagnosis present

## 2020-11-09 DIAGNOSIS — O1404 Mild to moderate pre-eclampsia, complicating childbirth: Principal | ICD-10-CM | POA: Diagnosis present

## 2020-11-09 LAB — CBC
HCT: 38.3 % (ref 36.0–46.0)
Hemoglobin: 13 g/dL (ref 12.0–15.0)
MCH: 30.3 pg (ref 26.0–34.0)
MCHC: 33.9 g/dL (ref 30.0–36.0)
MCV: 89.3 fL (ref 80.0–100.0)
Platelets: 212 10*3/uL (ref 150–400)
RBC: 4.29 MIL/uL (ref 3.87–5.11)
RDW: 13.2 % (ref 11.5–15.5)
WBC: 10 10*3/uL (ref 4.0–10.5)
nRBC: 0 % (ref 0.0–0.2)

## 2020-11-09 LAB — COMPREHENSIVE METABOLIC PANEL
ALT: 18 U/L (ref 0–44)
AST: 23 U/L (ref 15–41)
Albumin: 2.9 g/dL — ABNORMAL LOW (ref 3.5–5.0)
Alkaline Phosphatase: 119 U/L (ref 38–126)
Anion gap: 8 (ref 5–15)
BUN: 8 mg/dL (ref 6–20)
CO2: 24 mmol/L (ref 22–32)
Calcium: 8.7 mg/dL — ABNORMAL LOW (ref 8.9–10.3)
Chloride: 103 mmol/L (ref 98–111)
Creatinine, Ser: 0.88 mg/dL (ref 0.44–1.00)
GFR, Estimated: >60 mL/min (ref >60)
Glucose, Bld: 95 mg/dL (ref 70–99)
Potassium: 3.8 mmol/L (ref 3.5–5.1)
Sodium: 135 mmol/L (ref 135–145)
Total Bilirubin: 0.5 mg/dL (ref 0.3–1.2)
Total Protein: 6.2 g/dL — ABNORMAL LOW (ref 6.5–8.1)

## 2020-11-09 LAB — TYPE AND SCREEN
ABO/RH(D): O NEG
Antibody Screen: POSITIVE

## 2020-11-09 LAB — PROTEIN / CREATININE RATIO, URINE
Creatinine, Urine: 56.06 mg/dL
Protein Creatinine Ratio: 0.32 mg/mg{Cre} — ABNORMAL HIGH (ref 0.00–0.15)
Total Protein, Urine: 18 mg/dL

## 2020-11-09 LAB — RESP PANEL BY RT-PCR (FLU A&B, COVID) ARPGX2
Influenza A by PCR: NEGATIVE
Influenza B by PCR: NEGATIVE
SARS Coronavirus 2 by RT PCR: NEGATIVE

## 2020-11-09 LAB — RPR: RPR Ser Ql: NONREACTIVE

## 2020-11-09 MED ORDER — ZOLPIDEM TARTRATE 5 MG PO TABS: 5.0000 mg | ORAL_TABLET | Freq: Every evening | ORAL | Status: DC | PRN

## 2020-11-09 MED ORDER — SOD CITRATE-CITRIC ACID 500-334 MG/5ML PO SOLN: 30.0000 mL | ORAL | Status: DC | PRN

## 2020-11-09 MED ORDER — MISOPROSTOL 200 MCG PO TABS
400.0000 ug | ORAL_TABLET | Freq: Once | ORAL | Status: AC
  Administered 2020-11-09: 400 ug via RECTAL

## 2020-11-09 MED ORDER — DIBUCAINE (PERIANAL) 1 % EX OINT
1.0000 | TOPICAL_OINTMENT | CUTANEOUS | Status: DC | PRN
Start: 2020-11-09 — End: 2020-11-10

## 2020-11-09 MED ORDER — LACTATED RINGERS IV SOLN: 500.0000 mL | Freq: Once | INTRAVENOUS | Status: DC

## 2020-11-09 MED ORDER — COCONUT OIL OIL: 1.0000 "application " | TOPICAL_OIL | Status: DC | PRN

## 2020-11-09 MED ORDER — LACTATED RINGERS IV SOLN: INTRAVENOUS | Status: DC

## 2020-11-09 MED ORDER — ONDANSETRON HCL 4 MG/2ML IJ SOLN: 4.0000 mg | INTRAMUSCULAR | Status: DC | PRN

## 2020-11-09 MED ORDER — EPHEDRINE 5 MG/ML INJ: 10.0000 mg | INTRAVENOUS | Status: DC | PRN

## 2020-11-09 MED ORDER — OXYCODONE-ACETAMINOPHEN 5-325 MG PO TABS: 1.0000 | ORAL_TABLET | ORAL | Status: DC | PRN

## 2020-11-09 MED ORDER — TETANUS-DIPHTH-ACELL PERTUSSIS 5-2.5-18.5 LF-MCG/0.5 IM SUSY
0.5000 mL | PREFILLED_SYRINGE | Freq: Once | INTRAMUSCULAR | Status: DC
Start: 2020-11-10 — End: 2020-11-09

## 2020-11-09 MED ORDER — DIPHENHYDRAMINE HCL 50 MG/ML IJ SOLN: 12.5000 mg | INTRAMUSCULAR | Status: DC | PRN

## 2020-11-09 MED ORDER — MISOPROSTOL 200 MCG PO TABS
ORAL_TABLET | ORAL | Status: AC
  Filled 2020-11-09: qty 4

## 2020-11-09 MED ORDER — OXYCODONE HCL 5 MG PO TABS: 5.0000 mg | ORAL_TABLET | ORAL | Status: DC | PRN

## 2020-11-09 MED ORDER — OXYTOCIN-SODIUM CHLORIDE 30-0.9 UT/500ML-% IV SOLN
2.5000 [IU]/h | INTRAVENOUS | Status: DC
  Filled 2020-11-09: qty 500

## 2020-11-09 MED ORDER — FENTANYL CITRATE (PF) 100 MCG/2ML IJ SOLN
100.0000 ug | INTRAMUSCULAR | Status: DC | PRN
  Administered 2020-11-09 (×3): 100 ug via INTRAVENOUS
  Filled 2020-11-09 (×3): qty 2

## 2020-11-09 MED ORDER — ONDANSETRON HCL 4 MG PO TABS: 4.0000 mg | ORAL_TABLET | ORAL | Status: DC | PRN

## 2020-11-09 MED ORDER — PHENYLEPHRINE 40 MCG/ML (10ML) SYRINGE FOR IV PUSH (FOR BLOOD PRESSURE SUPPORT): 80.0000 ug | PREFILLED_SYRINGE | INTRAVENOUS | Status: DC | PRN

## 2020-11-09 MED ORDER — WITCH HAZEL-GLYCERIN EX PADS: 1.0000 "application " | MEDICATED_PAD | CUTANEOUS | Status: DC | PRN

## 2020-11-09 MED ORDER — MEASLES, MUMPS & RUBELLA VAC IJ SOLR: 0.5000 mL | Freq: Once | INTRAMUSCULAR | Status: DC

## 2020-11-09 MED ORDER — ACETAMINOPHEN 325 MG PO TABS: 650.0000 mg | ORAL_TABLET | ORAL | Status: DC | PRN

## 2020-11-09 MED ORDER — MISOPROSTOL 200 MCG PO TABS
400.0000 ug | ORAL_TABLET | Freq: Once | ORAL | Status: AC
  Administered 2020-11-09: 400 ug via ORAL

## 2020-11-09 MED ORDER — MEDROXYPROGESTERONE ACETATE 150 MG/ML IM SUSP
150.0000 mg | Freq: Once | INTRAMUSCULAR | Status: AC
  Administered 2020-11-10: 150 mg via INTRAMUSCULAR
  Filled 2020-11-09: qty 1

## 2020-11-09 MED ORDER — LACTATED RINGERS IV SOLN: 500.0000 mL | INTRAVENOUS | Status: DC | PRN

## 2020-11-09 MED ORDER — LIDOCAINE HCL (PF) 1 % IJ SOLN
INTRAMUSCULAR | Status: DC | PRN
  Administered 2020-11-09: 8 mL via EPIDURAL

## 2020-11-09 MED ORDER — EPHEDRINE 5 MG/ML INJ
10.0000 mg | INTRAVENOUS | Status: DC | PRN
Start: 2020-11-09 — End: 2020-11-09

## 2020-11-09 MED ORDER — DIPHENHYDRAMINE HCL 25 MG PO CAPS: 25.0000 mg | ORAL_CAPSULE | Freq: Four times a day (QID) | ORAL | Status: DC | PRN

## 2020-11-09 MED ORDER — PHENYLEPHRINE 40 MCG/ML (10ML) SYRINGE FOR IV PUSH (FOR BLOOD PRESSURE SUPPORT)
80.0000 ug | PREFILLED_SYRINGE | INTRAVENOUS | Status: DC | PRN
  Filled 2020-11-09: qty 10

## 2020-11-09 MED ORDER — IBUPROFEN 600 MG PO TABS
600.0000 mg | ORAL_TABLET | Freq: Four times a day (QID) | ORAL | Status: DC
Start: 2020-11-09 — End: 2020-11-10
  Administered 2020-11-09 – 2020-11-10 (×3): 600 mg via ORAL
  Filled 2020-11-09 (×3): qty 1

## 2020-11-09 MED ORDER — PRENATAL MULTIVITAMIN CH
1.0000 | ORAL_TABLET | Freq: Every day | ORAL | Status: DC
  Administered 2020-11-10: 1 via ORAL
  Filled 2020-11-09: qty 1

## 2020-11-09 MED ORDER — ONDANSETRON HCL 4 MG/2ML IJ SOLN
4.0000 mg | Freq: Four times a day (QID) | INTRAMUSCULAR | Status: DC | PRN
  Administered 2020-11-09: 4 mg via INTRAVENOUS
  Filled 2020-11-09: qty 2

## 2020-11-09 MED ORDER — OXYCODONE-ACETAMINOPHEN 5-325 MG PO TABS: 2.0000 | ORAL_TABLET | ORAL | Status: DC | PRN

## 2020-11-09 MED ORDER — BENZOCAINE-MENTHOL 20-0.5 % EX AERO: 1.0000 "application " | INHALATION_SPRAY | CUTANEOUS | Status: DC | PRN

## 2020-11-09 MED ORDER — SENNOSIDES-DOCUSATE SODIUM 8.6-50 MG PO TABS
2.0000 | ORAL_TABLET | ORAL | Status: DC
  Administered 2020-11-09: 2 via ORAL
  Filled 2020-11-09: qty 2

## 2020-11-09 MED ORDER — SIMETHICONE 80 MG PO CHEW: 80.0000 mg | CHEWABLE_TABLET | ORAL | Status: DC | PRN

## 2020-11-09 MED ORDER — LIDOCAINE HCL (PF) 1 % IJ SOLN: 30.0000 mL | INTRAMUSCULAR | Status: DC | PRN

## 2020-11-09 MED ORDER — OXYTOCIN BOLUS FROM INFUSION
333.0000 mL | Freq: Once | INTRAVENOUS | Status: AC
  Administered 2020-11-09: 333 mL via INTRAVENOUS

## 2020-11-09 MED ORDER — FENTANYL-BUPIVACAINE-NACL 0.5-0.125-0.9 MG/250ML-% EP SOLN
12.0000 mL/h | EPIDURAL | Status: DC | PRN
  Administered 2020-11-09: 12 mL/h via EPIDURAL
  Filled 2020-11-09: qty 250

## 2020-11-09 NOTE — Anesthesia Preprocedure Evaluation (Signed)
Anesthesia Evaluation  Patient identified by MRN, date of birth, ID band Patient awake    Reviewed: Allergy & Precautions, NPO status , Patient's Chart, lab work & pertinent test results  Airway Mallampati: II  TM Distance: >3 FB Neck ROM: Full    Dental no notable dental hx.    Pulmonary neg pulmonary ROS,    Pulmonary exam normal breath sounds clear to auscultation       Cardiovascular negative cardio ROS Normal cardiovascular exam Rhythm:Regular Rate:Normal     Neuro/Psych negative neurological ROS  negative psych ROS   GI/Hepatic negative GI ROS, Neg liver ROS,   Endo/Other  negative endocrine ROS  Renal/GU negative Renal ROS  negative genitourinary   Musculoskeletal negative musculoskeletal ROS (+)   Abdominal   Peds negative pediatric ROS (+)  Hematology  (+) Sickle cell trait ,   Anesthesia Other Findings   Reproductive/Obstetrics negative OB ROS                             Anesthesia Physical Anesthesia Plan  ASA: 2  Anesthesia Plan: Epidural   Post-op Pain Management:    Induction:   PONV Risk Score and Plan: 2  Airway Management Planned: Natural Airway  Additional Equipment:   Intra-op Plan:   Post-operative Plan:   Informed Consent: I have reviewed the patients History and Physical, chart, labs and discussed the procedure including the risks, benefits and alternatives for the proposed anesthesia with the patient or authorized representative who has indicated his/her understanding and acceptance.       Plan Discussed with: CRNA, Anesthesiologist and Surgeon  Anesthesia Plan Comments:         Anesthesia Quick Evaluation

## 2020-11-09 NOTE — H&P (Signed)
OBSTETRIC ADMISSION HISTORY AND PHYSICAL  Sharon Webb is a 20 y.o. female G1P0 with IUP at [redacted]w[redacted]d by LMP presenting for active labor at term and ROM. She reports +FMs, No LOF, no VB, no blurry vision, headaches or peripheral edema, and RUQ pain.  She plans on breast feeding. She request Depo for birth control. She received her prenatal care at [facility]   Dating: By LMP --->  Estimated Date of Delivery: 11/11/20  Sono:    @[redacted]w[redacted]d , CWD, normal anatomy, 2407g, 58% EFW   Prenatal History/Complications: Rh negative, Chlamydia tx 06/14/20, TOC Negative  Past Medical History: Past Medical History:  Diagnosis Date   Medical history non-contributory     Past Surgical History: Past Surgical History:  Procedure Laterality Date   NO PAST SURGERIES      Obstetrical History: OB History     Gravida  1   Para      Term      Preterm      AB      Living         SAB      IAB      Ectopic      Multiple      Live Births              Social History Social History   Socioeconomic History   Marital status: 06/16/20    Spouse name: Not on file   Number of children: Not on file   Years of education: Not on file   Highest education level: Not on file  Occupational History   Not on file  Tobacco Use   Smoking status: Never   Smokeless tobacco: Never  Vaping Use   Vaping Use: Never used  Substance and Sexual Activity   Alcohol use: No   Drug use: No   Sexual activity: Yes    Birth control/protection: None  Other Topics Concern   Not on file  Social History Narrative   Not on file   Social Determinants of Health   Financial Resource Strain: Low Risk    Difficulty of Paying Living Expenses: Not hard at all  Food Insecurity: No Food Insecurity   Worried About Media planner in the Last Year: Never true   Ran Out of Food in the Last Year: Never true  Transportation Needs: No Transportation Needs   Lack of Transportation (Medical): No   Lack of  Transportation (Non-Medical): No  Physical Activity: Sufficiently Active   Days of Exercise per Week: 7 days   Minutes of Exercise per Session: 90 min  Stress: No Stress Concern Present   Feeling of Stress : Not at all  Social Connections: Moderately Integrated   Frequency of Communication with Friends and Family: More than three times a week   Frequency of Social Gatherings with Friends and Family: Three times a week   Attends Religious Services: More than 4 times per year   Active Member of Clubs or Organizations: No   Attends Programme researcher, broadcasting/film/video Meetings: Never   Marital Status: Living with partner    Family History: Family History  Problem Relation Age of Onset   Cancer Maternal Grandfather        prostate    Allergies: Allergies  Allergen Reactions   Coconut Flavor Itching and Swelling    Medications Prior to Admission  Medication Sig Dispense Refill Last Dose   aspirin 81 MG EC tablet Take 1 tablet (81 mg total) by mouth daily.  Swallow whole. 90 tablet 6 11/08/2020   Doxylamine-Pyridoxine (DICLEGIS) 10-10 MG TBEC 2 tabs q hs, if sx persist add 1 tab q am on day 3, if sx persist add 1 tab q afternoon on day 4 100 tablet 6 Past Week   ondansetron (ZOFRAN ODT) 4 MG disintegrating tablet Take 1 tablet (4 mg total) by mouth every 6 (six) hours as needed for nausea. 30 tablet 2 11/08/2020   Pediatric Multivitamins-Iron (FLINTSTONES COMPLETE) 18 MG CHEW Chew 2 daily   11/08/2020   Blood Pressure Monitoring (BLOOD PRESSURE CUFF) MISC 1 Device by Does not apply route daily. 1 each 0    ferrous sulfate 325 (65 FE) MG tablet Take 1 tablet (325 mg total) by mouth every other day. 45 tablet 2      Review of Systems   All systems reviewed and negative except as stated in HPI  Blood pressure 118/88, pulse (!) 104, temperature 98.6 F (37 C), resp. rate 17, height 5\' 7"  (1.702 m), weight 69.4 kg, last menstrual period 01/14/2020. General appearance: alert, cooperative, and no  distress Lungs: clear to auscultation bilaterally Heart: regular rate and rhythm Abdomen: soft, non-tender; bowel sounds normal Extremities: Homans sign is negative, no sign of DVT DTR's wnl Presentation: cephalic Fetal monitoringBaseline: 135 bpm Uterine activityFrequency: Every 5 minutes Dilation: 4.5 Effacement (%): 70 Station: -2 Exam by:: DCALLAWAY, RN   Prenatal labs: ABO, Rh: O/Negative/-- (02/01 1625) Antibody: Negative (05/23 0853) Rubella: 3.47 (02/01 1625) RPR: Non Reactive (05/23 0853)  HBsAg: Negative (02/01 1625)  HIV: Non Reactive (05/23 0853)  GBS: Negative/-- (08/08 1530)  2 hour glucola  wnl, 66, 101, 59 Genetic screening  Panorama low risk Anatomy 12-23-1993 wnl female  Prenatal Transfer Tool  Maternal Diabetes: No Genetic Screening: Normal Maternal Ultrasounds/Referrals: Normal Fetal Ultrasounds or other Referrals:  None Maternal Substance Abuse:  No Significant Maternal Medications:  None Significant Maternal Lab Results: Group B Strep negative  Results for orders placed or performed in visit on 11/08/20 (from the past 24 hour(s))  POCT Wet Prep 11/10/20 [place])   Collection Time: 11/08/20  3:55 PM  Result Value Ref Range   Source Wet Prep POC vaginal    WBC, Wet Prep HPF POC few    Bacteria Wet Prep HPF POC Few Few   BACTERIA WET PREP MORPHOLOGY POC     Clue Cells Wet Prep HPF POC None None   Clue Cells Wet Prep Whiff POC Negative Whiff    Yeast Wet Prep HPF POC None None   KOH Wet Prep POC     Trichomonas Wet Prep HPF POC Absent Absent    Patient Active Problem List   Diagnosis Date Noted   Normal labor 11/09/2020   Chlamydia 06/14/2020   Sickle cell trait (HCC) 05/24/2020   Supervision of normal first pregnancy 05/10/2020   Rh negative state in antepartum period 05/10/2020    Assessment/Plan:  Sharon Webb is a 20 y.o. G1P0 at [redacted]w[redacted]d here for active labor and ROM GBS neg  #Labor:active labor #Pain: May have epidural when  desired #FWB: Category I  #ID:  GBS negative #MOF: breast #MOC:depo #Circ:  N/a  [redacted]w[redacted]d, CNM  11/09/2020, 4:25 AM

## 2020-11-09 NOTE — Discharge Summary (Signed)
Postpartum Discharge Summary   Patient Name: Sharon Webb DOB: 03/01/2001 MRN: [id number]  Date of admission: 11/09/2020 Delivery date:11/09/2020  Delivering provider: Serita Grammes D  Date of discharge: 11/22/2020  Admitting diagnosis: Normal labor [O80, Z37.9] Intrauterine pregnancy: [redacted]w[redacted]d    Secondary diagnosis:  Active Problems:   Rh negative state in antepartum period   Sickle cell trait (HCC)   Normal labor   Pre-eclampsia, mild  Additional problems: none    Discharge diagnosis: Term Pregnancy Delivered and Preeclampsia (mild)                                              Post partum procedures:rhogam Augmentation:  none Complications: None  Hospital course: Onset of Labor With Vaginal Delivery      20y.o. yo G1P0 at 364w5das admitted in Latent Labor on 11/09/2020. Patient had a labor course remarkable for progressing spontaneously to vag del after epidural placement. She did have some mild range BP elevations in active labor prompting labs> elevated P/C 0.32 with neg CMP. She was asymptomatic and given a dx of pre-e without severe features.   Membrane Rupture Time/Date: 4:00 AM ,11/09/2020   Delivery Method:Vaginal, Spontaneous  Episiotomy: None  Lacerations:  2nd degree  Patient had an uncomplicated postpartum course.  BPs remained normotensive.  She is ambulating, tolerating a regular diet, passing flatus, and urinating well. Patient is discharged home in stable condition on 11/22/20.  Newborn Data: Birth date:11/09/2020  Birth time:2:26 PM  Gender:Female  Living status:Living  Apgars:9 ,9  Weight:3.43 kg (7lb 9oz)  Magnesium Sulfate received: No BMZ received: No Rhophylac:Yes MMR:N/A T-DaP:Given prenatally Flu: N/A Transfusion:No  Physical exam  Vitals:   11/09/20 2130 11/10/20 0100 11/10/20 0500 11/10/20 1345  BP: 133/82 121/73 115/72 118/72  Pulse: 63 61 65 (!) 57  Resp: _0 Temp: 99.5 F (37.5 C) 98.7 F (37.1 C) 98 F (36.7 C) 98.3 F  (36.8 C)  TempSrc: Oral Oral Oral   SpO2: 99%  100%   Weight:      Height:       Exam performed by KiDerrill MemoCNM and Dr. PhKathrin Ruddyeneral: alert Lochia: appropriate Uterine Fundus: firm DVT Evaluation: No calf swelling or tenderness Labs: Lab Results  Component Value Date   WBC 10.0 11/09/2020   HGB 13.0 11/09/2020   HCT 38.3 11/09/2020   MCV 89.3 11/09/2020   PLT 212 11/09/2020   CMP Latest Ref Rng & Units 11/09/2020  Glucose 70 - 99 mg/dL 95  BUN 6 - 20 mg/dL 8  Creatinine 0.44 - 1.00 mg/dL 0.88  Sodium 135 - 145 mmol/L 135  Potassium 3.5 - 5.1 mmol/L 3.8  Chloride 98 - 111 mmol/L 103  CO2 22 - 32 mmol/L 24  Calcium 8.9 - 10.3 mg/dL 8.7(L)  Total Protein 6.5 - 8.1 g/dL 6.2(L)  Total Bilirubin 0.3 - 1.2 mg/dL 0.5  Alkaline Phos 38 - 126 U/L 119  AST 15 - 41 U/L 23  ALT 0 - 44 U/L 18   Edinburgh Score: Edinburgh Postnatal Depression Scale Screening Tool 11/19/2020  I have been able to laugh and see the funny side of things. 0  I have looked forward with enjoyment to things. 0  I have blamed myself unnecessarily when things went wrong. 1  I have been anxious or worried for no  good reason. 1  I have felt scared or panicky for no good reason. 0  Things have been getting on top of me. 0  I have been so unhappy that I have had difficulty sleeping. 3  I have felt sad or miserable. 0  I have been so unhappy that I have been crying. 0  The thought of harming myself has occurred to me. 0  Edinburgh Postnatal Depression Scale Total 5     After visit meds:  Allergies as of 11/10/2020       Reactions   Coconut Flavor Itching, Swelling        Medication List     STOP taking these medications    aspirin 81 MG EC tablet       TAKE these medications    acetaminophen 325 MG tablet Commonly known as: Tylenol Take 2 tablets (650 mg total) by mouth every 4 (four) hours as needed (for pain scale < 4).   Blood Pressure Cuff Misc 1 Device by Does not apply route  daily.   ferrous sulfate 325 (65 FE) MG tablet Take 1 tablet (325 mg total) by mouth every other day.   Flintstones Complete 18 MG Chew Chew 2 daily   ibuprofen 600 MG tablet Commonly known as: ADVIL Take 1 tablet (600 mg total) by mouth every 6 (six) hours.         Discharge home in stable condition Infant Feeding: Breast Infant Disposition:home with mother Discharge instruction: per After Visit Summary and Postpartum booklet. Activity: Advance as tolerated. Pelvic rest for 6 weeks.  Diet: routine diet Future Appointments: Future Appointments  Date Time Provider [place]  12/14/2020 11:50 AM Myrtis Ser, CNM [facility]   Follow up Visit:  Myrtis Ser, CNM  Gloris [place] Please schedule this patient for Postpartum visit in: 4 weeks with the following provider: Any provider  In-Person  For C/S patients schedule nurse incision check in weeks 2 weeks: no  High risk pregnancy complicated by: pre-e w/o SF dx IP  Delivery mode:  SVD  Anticipated Birth Control:  Depo  PP Procedures needed: BP check in 1wk  Schedule Integrated [place] visit: no  Renard Matter, MD, MPH OB Fellow, Faculty Practice

## 2020-11-09 NOTE — Progress Notes (Signed)
Sharon Webb is a 20 y.o. G1P0 at [redacted]w[redacted]d by LMP admitted for active labor  Subjective: Patient breathing well through ctx, FOB providing support  Objective: BP 129/79   Pulse (!) 54   Temp 98.4 F (36.9 C) (Oral)   Resp 12   Ht 5\' 7"  (1.702 m)   Wt 69.4 kg   LMP 01/14/2020   BMI 23.96 kg/m  No intake/output data recorded. No intake/output data recorded.  FHT:  FHR: 130 bpm, variability: moderate,  accelerations:  Present,  decelerations:  Absent UC:   regular, every 4-5 minutes SVE:   Dilation: 6 Effacement (%): 90 Station: Plus 1 Exam by:: Dr. [email or web address]  Labs: Lab Results  Component Value Date   WBC 10.0 11/09/2020   HGB 13.0 11/09/2020   HCT 38.3 11/09/2020   MCV 89.3 11/09/2020   PLT 212 11/09/2020    Assessment / Plan: Spontaneous labor, progressing normally  Labor: Progressing normally. Can offer pitocin if ctx pattern slows down. Preeclampsia:   n/a Fetal Wellbeing:  Category I Pain Control:  IV pain meds- counseled patient that she can get epidural at any time, she has good control through ctx now and wants to walk/ use labor ball, so will do IV pain meds at this time I/D:   GBS neg Anticipated MOD:  NSVD  11/11/2020 11/09/2020, 10:21 AM

## 2020-11-09 NOTE — MAU Note (Signed)
Ctxs since 2300 with some bloody show. Was 3cm yesterday in office.

## 2020-11-09 NOTE — Lactation Note (Signed)
This note was copied from a baby's chart. Lactation Consultation Note  Patient Name: Sharon Webb [id number] Date: 11/09/2020 Reason for consult: L&D Initial assessment;Mother's request;Primapara;1st time breastfeeding;Term Age:21 hours LC assisted with changing latch cradle to cross cradle tummy to tummy. Infant completed 15 min feeding according to Mother. LC assisted removing infant from the breast and infant fell asleep.  LC to provided further support on the floor. LC went over feeding cues and feeds 8-12x in 24hr period.  All questions answered at the end of the visit.   Maternal Data Has patient been taught Hand Expression?: Yes  Feeding Mother's Current Feeding Choice: Breast Milk  LATCH Score Latch: Repeated attempts needed to sustain latch, nipple held in mouth throughout feeding, stimulation needed to elicit sucking reflex.  Audible Swallowing: A few with stimulation  Type of Nipple: Everted at rest and after stimulation  Comfort (Breast/Nipple): Soft / non-tender  Hold (Positioning): Assistance needed to correctly position infant at breast and maintain latch.  LATCH Score: 7   Lactation Tools Discussed/Used    Interventions Interventions: Breast feeding basics reviewed;Assisted with latch;Skin to skin;Support pillows  Discharge    Consult Status Consult Status: Follow-up Date: 11/10/20 Follow-up type: In-patient    Sharon Abdelrahman  Webb 11/09/2020, 4:03 PM

## 2020-11-09 NOTE — Progress Notes (Signed)
Patient having consistent BP in 130s/80s. Denies HA, vision changes, RUQ pain. Will get CMP and urine protein/creatinine ratio for baseline and continue to monitor.

## 2020-11-09 NOTE — Anesthesia Procedure Notes (Signed)
Epidural Patient location during procedure: OB Start time: 11/09/2020 10:55 AM End time: 11/09/2020 11:05 AM  Staffing Anesthesiologist: Mellody Dance, MD Performed: anesthesiologist   Preanesthetic Checklist Completed: patient identified, IV checked, site marked, risks and benefits discussed, monitors and equipment checked, pre-op evaluation and timeout performed  Epidural Patient position: sitting Prep: DuraPrep Patient monitoring: heart rate, cardiac monitor, continuous pulse ox and blood pressure Approach: midline Location: L3-L4 Injection technique: LOR saline  Needle:  Needle type: Tuohy  Needle gauge: 17 G Needle length: 9 cm Needle insertion depth: 5 cm Catheter type: closed end Catheter size: 20 Guage Catheter at skin depth: 9 cm Test dose: negative and Other  Assessment Events: blood not aspirated, injection not painful, no injection resistance and negative IV test  Additional Notes Informed consent obtained prior to proceeding including risk of failure, 1% risk of PDPH, risk of minor discomfort and bruising.  Discussed rare but serious complications including epidural abscess, permanent nerve injury, epidural hematoma.  Discussed alternatives to epidural analgesia and patient desires to proceed.  Timeout performed pre-procedure verifying patient name, procedure, and platelet count.  Patient tolerated procedure well.

## 2020-11-10 MED ORDER — RHO D IMMUNE GLOBULIN 1500 UNIT/2ML IJ SOSY
300.0000 ug | PREFILLED_SYRINGE | Freq: Once | INTRAMUSCULAR | Status: AC
  Administered 2020-11-10: 300 ug via INTRAVENOUS
  Filled 2020-11-10: qty 2

## 2020-11-10 MED ORDER — ACETAMINOPHEN 325 MG PO TABS: 650.0000 mg | ORAL_TABLET | ORAL | 0 refills | Status: AC | PRN

## 2020-11-10 MED ORDER — IBUPROFEN 600 MG PO TABS: 600.0000 mg | ORAL_TABLET | Freq: Four times a day (QID) | ORAL | 0 refills | Status: DC

## 2020-11-10 NOTE — Lactation Note (Signed)
This note was copied from a baby's chart. Lactation Consultation Note  Patient Name: Sharon Webb [id number] Date: 11/10/2020 Reason for consult: Follow-up assessment Age:21 hours  P1, Baby has been cluster feeding.  Assisted with latching baby in cross cradle to cradle hold. Feed on demand with cues.  Goal 8-12+ times per day after first 24 hrs. Mom made aware of O/P services, breastfeeding support groups, and our phone # for post-discharge questions.    Maternal Data Has patient been taught Hand Expression?: Yes Does the patient have breastfeeding experience prior to this delivery?: No  Feeding Mother's Current Feeding Choice: Breast Milk  LATCH Score Latch: Grasps breast easily, tongue down, lips flanged, rhythmical sucking.  Audible Swallowing: A few with stimulation  Type of Nipple: Everted at rest and after stimulation  Comfort (Breast/Nipple): Soft / non-tender  Hold (Positioning): Assistance needed to correctly position infant at breast and maintain latch.  LATCH Score: 8   Lactation Tools Discussed/Used  Home personal DEBP  Interventions Interventions: Breast feeding basics reviewed;Assisted with latch;Skin to skin;Hand express;Support pillows;Adjust position;Education   Consult Status Consult Status: Follow-up Date: 11/11/20 Follow-up type: In-patient    Dahlia Byes [facility] 11/10/2020, 8:57 AM

## 2020-11-10 NOTE — Progress Notes (Signed)
POSTPARTUM PROGRESS NOTE  Subjective: Sharon Webb is a 20 y.o. G1P1001 s/p SVD at [redacted]w[redacted]d.  She reports she doing well. No acute events overnight. She denies any problems with ambulating, voiding or po intake. Denies nausea or vomiting. She has passed flatus. Pain is well controlled.  Lochia is normal amount.  Objective: Blood pressure 115/72, pulse 65, temperature 98 F (36.7 C), temperature source Oral, resp. rate 16, height 5\' 7"  (1.702 m), weight 69.4 kg, last menstrual period 01/14/2020, SpO2 100 %, unknown if currently breastfeeding.  Physical Exam:  General: alert, cooperative and no distress Chest: no respiratory distress Abdomen: soft, non-tender  Uterine Fundus: firm and at level of umbilicus Extremities: No calf swelling or tenderness  no edema  Recent Labs    11/09/20 0410  HGB 13.0  HCT 38.3    Assessment/Plan: Jacquel Redditt is a 20 y.o. G1P1001 s/p SVD at [redacted]w[redacted]d for SOL and SROM.  Routine Postpartum Care: Doing well, pain well-controlled.  -- Monitor post-partum BP's -- Continue routine care, lactation support  -- Rhogam administration today -- Contraception: Depo -- Feeding: Breast  Dispo: Plan for discharge tomorrow.  [redacted]w[redacted]d, MD 11/10/2020 8:25 AM

## 2020-11-10 NOTE — Lactation Note (Signed)
This note was copied from a baby's chart. Lactation Consultation Note Attempted to see mom. Mom sleeping.  Patient Name: Sharon Webb [id number] Date: 11/10/2020   Age:21 hours  Maternal Data    Feeding    LATCH Score                    Lactation Tools Discussed/Used    Interventions    Discharge    Consult Status      [name] 11/10/2020, 4:59 AM

## 2020-11-10 NOTE — Anesthesia Postprocedure Evaluation (Signed)
Anesthesia Post Note  Patient: [name]  Procedure(s) Performed: AN AD HOC LABOR EPIDURAL     Patient location during evaluation: Mother Baby Anesthesia Type: Epidural Level of consciousness: awake and alert Pain management: pain level controlled Vital Signs Assessment: post-procedure vital signs reviewed and stable Respiratory status: spontaneous breathing, nonlabored ventilation and respiratory function stable Cardiovascular status: stable Postop Assessment: no headache, no backache, epidural receding, no apparent nausea or vomiting, patient able to bend at knees, adequate PO intake and able to ambulate Anesthetic complications: no   No notable events documented.  Last Vitals:  Vitals:   11/10/20 0100 11/10/20 0500  BP: 121/73 115/72  Pulse: 61 65  Resp: 15 16  Temp: 37.1 C 36.7 C  SpO2:  100%    Last Pain:  Vitals:   11/10/20 0500  TempSrc: Oral  PainSc:    Pain Goal: Patients Stated Pain Goal: 0 (11/09/20 0056)                 Laban Emperor

## 2020-11-11 LAB — RH IG WORKUP (INCLUDES ABO/RH)
Fetal Screen: NEGATIVE
Gestational Age(Wks): 39.5
Unit division: 0

## 2020-11-14 ENCOUNTER — Other Ambulatory Visit: Payer: Medicaid Other | Admitting: Women's Health

## 2020-11-16 ENCOUNTER — Ambulatory Visit (INDEPENDENT_AMBULATORY_CARE_PROVIDER_SITE_OTHER): Payer: Medicaid Other

## 2020-11-16 ENCOUNTER — Other Ambulatory Visit: Payer: Self-pay

## 2020-11-16 VITALS — BP 107/77 | HR 83

## 2020-11-16 DIAGNOSIS — Z0130 Encounter for examination of blood pressure without abnormal findings: Secondary | ICD-10-CM

## 2020-11-16 NOTE — Progress Notes (Addendum)
   NURSE VISIT- BLOOD PRESSURE CHECK  SUBJECTIVE:  Sharon Webb is a 20 y.o. G37P1001 female here for BP check. She is postpartum, delivery date 11/09/20     HYPERTENSION ROS:  Postpartum:  Severe headaches that don't go away with tylenol/other medicines: No  Visual changes (seeing spots/double/blurred vision) No  Severe pain under right breast breast or in center of upper chest No  Severe nausea/vomiting No  Taking medicines as instructed not applicable  OBJECTIVE:  BP 107/77 (BP Location: Left Arm, Patient Position: Sitting, Cuff Size: Normal)   Pulse 83   LMP 01/14/2020   Breastfeeding Yes Comment: Bottlefeeding to supplement  Appearance alert, well appearing, and in no distress, oriented to person, place, and time, and normal appearing weight.  ASSESSMENT: Postpartum  blood pressure check  PLAN: Discussed with Sharon Webb, CNM   Recommendations: no changes needed   Follow-up: as scheduled   Caly Pellum A Paz Fuentes  11/16/2020 10:21 AM   Chart reviewed for nurse visit. Agree with plan of care.  Arabella Merles, CNM 11/16/2020 3:07 PM

## 2020-11-17 ENCOUNTER — Encounter: Payer: Self-pay | Admitting: *Deleted

## 2020-11-19 ENCOUNTER — Telehealth (HOSPITAL_COMMUNITY): Payer: Self-pay

## 2020-11-19 NOTE — Telephone Encounter (Signed)
"  I'm doing good. I'm feeling good. I'm fine." Patient has no questions or concerns about her healing.  "She is good. She is getting big already. She sleeps in her bassinet."RN reviewed ABC's of safe sleep with patient. Patient declines any questions or concerns about baby.  EPDS score is 5.  Marcelino Duster [facility] 11/19/2020,1558

## 2020-11-21 ENCOUNTER — Encounter: Payer: Self-pay | Admitting: Family Medicine

## 2020-12-14 ENCOUNTER — Other Ambulatory Visit: Payer: Self-pay

## 2020-12-14 ENCOUNTER — Encounter: Payer: Self-pay | Admitting: Advanced Practice Midwife

## 2020-12-14 ENCOUNTER — Ambulatory Visit (INDEPENDENT_AMBULATORY_CARE_PROVIDER_SITE_OTHER): Payer: Medicaid Other | Admitting: Advanced Practice Midwife

## 2020-12-14 MED ORDER — XULANE 150-35 MCG/24HR TD PTWK: 1.0000 | MEDICATED_PATCH | TRANSDERMAL | 12 refills | Status: DC

## 2020-12-14 NOTE — Progress Notes (Signed)
POSTPARTUM VISIT Patient name: Sharon Webb MRN [id number]  Date of birth: 2001-02-17 Chief Complaint:   Postpartum Care [facility])  History of Present Illness:   Sharon Webb is a 20 y.o. G1P1001 African American female being seen today for a postpartum visit. She is 5 weeks postpartum following a spontaneous vaginal delivery at 39.5 gestational weeks. IOL: no. Anesthesia: epidural.  Laceration: L labial and 2nd deg perineal.  Complications: pre-e without severe features dx IP- was normotensive during her PP stay and did not require meds. Inpatient contraception: no.   Pregnancy uncomplicated. Tobacco use: no. Substance use disorder: no. Last pap smear: May 2022 and results were NILM w/ HRHPV not done. Next pap smear due: May 2025 Patient's last menstrual period was 12/11/2020 (exact date).  Postpartum course has been uncomplicated. Bleeding  first cycle . Bowel function is normal. Bladder function is normal. Urinary incontinence? no, fecal incontinence? no Patient is not sexually active. Last sexual activity: prior to birth of baby. Desired contraception:  patch . Patient does not know about a pregnancy in the future.  Desired family size is unsure children.   Upstream - 12/14/20 1202       Pregnancy Intention Screening   Does the patient want to become pregnant in the next year? No    Does the patient's partner want to become pregnant in the next year? No    Would the patient like to discuss contraceptive options today? Yes      Contraception Wrap Up   Current Method Abstinence    End Method Contraceptive Patch    Contraception Counseling Provided Yes            The pregnancy intention screening data noted above was reviewed. Potential methods of contraception were discussed. The patient elected to proceed with Contraceptive Patch.  Edinburgh Postpartum Depression Screening: positive: H/O mental health disorder: no. Currently on meds: no.  Currently in therapy: no.   Sleeping: tired from the baby.  Appetite: nl.  Still finds joy in things she used to: Yes.  Support at home: yes- mother and family.  SI/HI/II: no.  Interested in medicine: No.  Interested in therapy: No.  Edinburgh Postnatal Depression Scale - 12/14/20 1202       Edinburgh Postnatal Depression Scale:  In the Past 7 Days   I have been able to laugh and see the funny side of things. 0    I have looked forward with enjoyment to things. 0    I have blamed myself unnecessarily when things went wrong. 2    I have been anxious or worried for no good reason. 2    I have felt scared or panicky for no good reason. 1    Things have been getting on top of me. 1    I have been so unhappy that I have had difficulty sleeping. 0    I have felt sad or miserable. 0    I have been so unhappy that I have been crying. 0    The thought of harming myself has occurred to me. 0    Edinburgh Postnatal Depression Scale Total 6             GAD 7 : Generalized Anxiety Score 08/15/2020 05/11/2020 04/19/2020  Nervous, Anxious, on Edge 0 0 0  Control/stop worrying 0 0 0  Worry too much - different things 0 0 1  Trouble relaxing 0 0 0  Restless 0 0 0  Easily annoyed or irritable 0  0 1  Afraid - awful might happen 0 0 0  Total GAD 7 Score 0 0 2     Baby's course has been uncomplicated. Baby is feeding by bottle. Infant has a pediatrician/family doctor? Yes.  Childcare strategy if returning to work/school: family.  Pt has material needs met for her and baby: Yes.   Review of Systems:   Pertinent items are noted in HPI Denies Abnormal vaginal discharge w/ itching/odor/irritation, headaches, visual changes, shortness of breath, chest pain, abdominal pain, severe nausea/vomiting, or problems with urination or bowel movements. Pertinent History Reviewed:  Reviewed past medical,surgical, obstetrical and family history.  Reviewed problem list, medications and allergies. OB History  Gravida Para Term Preterm AB Living   _0 SAB IAB Ectopic Multiple Live Births        0 1    # Outcome Date GA Lbr Len/2nd Weight Sex Delivery Anes PTL Lv  1 Term 11/09/20 [id number] / 01:25 7 lb 9 oz (3.43 kg) F Vag-Spont EPI  LIV   Physical Assessment:   Vitals:   12/14/20 1200  BP: 120/79  Pulse: 74  Weight: 128 lb 12.8 oz (58.4 kg)  Height: _1  (1.702 m)  Body mass index is 20.17 kg/m.       Physical Examination:   General appearance: alert, well appearing, and in no distress  Mental status: alert, oriented to person, place, and time  Skin: warm & dry   Cardiovascular: normal heart rate noted   Respiratory: normal respiratory effort, no distress   Breasts: deferred, no complaints   Abdomen: soft, non-tender   Pelvic: normal external genitalia, vulva, vagina, cervix, uterus and adnexa. Thin prep pap obtained: No  Rectal: not examined  Extremities: Edema: none         No results found for this or any previous visit (from the past 24 hour(s)).  Assessment & Plan:  1) Postpartum exam 2) Five wks s/p spontaneous vaginal delivery 3) bottle feeding 4) Depression screening- mildly elevated at 7; declines meds/therapy for now 5) Contraception management: - starting on Xulane this week 6) s/p mild pre-e intrapartum> did not require antihypertensives  Essential components of care per ACOG recommendations:  1.  Mood and well being:  If positive depression screen, discussed and plan developed.  If using tobacco we discussed reduction/cessation and risk of relapse If current substance abuse, we discussed and referral to local resources was offered.   2. Infant care and feeding:  If breastfeeding, discussed returning to work, pumping, breastfeeding-associated pain, guidance regarding return to fertility while lactating if not using another method. If needed, patient was provided with a letter to be allowed to pump q 2-3hrs to support lactation in a private location with access to a refrigerator to store  breastmilk.   Recommended that all caregivers be immunized for flu, pertussis and other preventable communicable diseases If pt does not have material needs met for her/baby, referred to local resources for help obtaining these.  3. Sexuality, contraception and birth spacing Provided guidance regarding sexuality, management of dyspareunia, and resumption of intercourse Discussed avoiding interpregnancy interval <624ms and recommended birth spacing of 18 months  4. Sleep and fatigue Discussed coping options for fatigue and sleep disruption Encouraged family/partner/community support of 4 hrs of uninterrupted sleep to help with mood and fatigue  5. Physical recovery  If pt had a C/S, assessed incisional pain and providing guidance on normal vs prolonged recovery If pt had a  laceration, perineal healing and pain reviewed.  If urinary or fecal incontinence, discussed management and referred to PT or uro/gyn if indicated  Patient is safe to resume physical activity. Discussed attainment of healthy weight.  6.  Chronic disease management Discussed pregnancy complications if any, and their implications for future childbearing and long-term maternal health. Review recommendations for prevention of recurrent pregnancy complications, such as 17 hydroxyprogesterone caproate to reduce risk for recurrent PTB not applicable, or aspirin to reduce risk of preeclampsia yes. Pt had GDM: no. If yes, 2hr GTT scheduled: not applicable. Reviewed medications and non-pregnant dosing including consideration of whether pt is breastfeeding using a reliable resource such as LactMed: not applicable Referred for f/u w/ PCP or subspecialist providers as indicated: not applicable  7. Health maintenance Mammogram at 21yo or earlier if indicated Pap smears as indicated  Meds:  Meds ordered this encounter  Medications   norelgestromin-ethinyl estradiol Marilu Favre) 150-35 MCG/24HR transdermal patch    Sig: Place 1 patch  onto the skin once a week.    Dispense:  3 patch    Refill:  12    Order Specific Question:   Supervising Provider    Answer:   Florian Buff [[id number]]    Follow-up: Return in about 3 months (around 03/15/2021) for f/u contraception.   No orders of the defined types were placed in this encounter.   Myrtis Ser CNM 12/14/2020 12:20 PM

## 2021-03-13 ENCOUNTER — Telehealth (INDEPENDENT_AMBULATORY_CARE_PROVIDER_SITE_OTHER): Payer: Medicaid Other | Admitting: Women's Health

## 2021-03-13 ENCOUNTER — Other Ambulatory Visit: Payer: Self-pay

## 2021-03-13 ENCOUNTER — Encounter: Payer: Self-pay | Admitting: Women's Health

## 2021-03-13 VITALS — BP 125/66 | HR 71

## 2021-03-13 DIAGNOSIS — Z3045 Encounter for surveillance of transdermal patch hormonal contraceptive device: Secondary | ICD-10-CM | POA: Diagnosis not present

## 2021-03-13 NOTE — Progress Notes (Signed)
° °  TELEHEALTH VIRTUAL GYN VISIT ENCOUNTER NOTE Patient name: Sharon Webb MRN [id number]  Date of birth: [birth year]  I connected with patient on 03/13/21 at  4:10 PM EST by MyChart video  and verified that I am speaking with the correct person using two identifiers.  Pt is not currently in the office, she is at home.  Provider is in the office.    I discussed the limitations, risks, security and privacy concerns of performing an evaluation and management service by telephone and the availability of in person appointments. I also discussed with the patient that there may be a patient responsible charge related to this service. The patient expressed understanding and agreed to proceed.   Chief Complaint:   Follow-up  History of Present Illness:   Sharon Webb is a 20 y.o. G75P1001 African American female being evaluated today for f/u on xulane patch rx'd 12/14/20. Doing well, no issues w/ it coming off, takes off on 4th week to have period.     Depression screen [facility] 2/9 08/15/2020 05/11/2020 04/19/2020 04/19/2020  Decreased Interest 0 0 1 1  Down, Depressed, Hopeless 0 0 0 0  PHQ - 2 Score 0 0 1 1  Altered sleeping 0 0 1 1  Tired, decreased energy 1 0 1 1  Change in appetite 0 0 1 1  Feeling bad or failure about yourself  0 0 0 0  Trouble concentrating 0 0 0 0  Moving slowly or fidgety/restless 0 0 0 0  Suicidal thoughts 0 0 0 0  PHQ-9 Score 1 0 4 4    Patient's last menstrual period was 03/06/2021. The current method of family planning is Ortho-Evra patches weekly.  Last pap 08/15/20. Results were:  NILM w/ HRHPV not done Review of Systems:   Pertinent items are noted in HPI Denies fever/chills, dizziness, headaches, visual disturbances, fatigue, shortness of breath, chest pain, abdominal pain, vomiting, abnormal vaginal discharge/itching/odor/irritation, problems with periods, bowel movements, urination, or intercourse unless otherwise stated above.  Pertinent History Reviewed:  Reviewed  past medical,surgical, social, obstetrical and family history.  Reviewed problem list, medications and allergies. Physical Assessment:   Vitals:   03/13/21 1615  BP: 125/66  Pulse: 71  There is no height or weight on file to calculate BMI.       Physical Examination:   General:  Alert, oriented and cooperative.   Mental Status: Normal mood and affect perceived. Normal judgment and thought content.  Physical exam deferred due to nature of the encounter  No results found for this or any previous visit (from the past 24 hour(s)).  Assessment & Plan:  1) Contraception surveillance> doing well on patch, continue & f/u in Sept for physical  Meds: No orders of the defined types were placed in this encounter.   No orders of the defined types were placed in this encounter.   I discussed the assessment and treatment plan with the patient. The patient was provided an opportunity to ask questions and all were answered. The patient agreed with the plan and demonstrated an understanding of the instructions.   The patient was advised to call back or seek an in-person evaluation/go to the ED if the symptoms worsen or if the condition fails to improve as anticipated.  I provided 10 minutes of non-face-to-face time during this encounter.   Return for Sept for , Physical.  Cheral Marker CNM, [facility] 03/13/2021 4:36 PM

## 2021-12-19 ENCOUNTER — Other Ambulatory Visit: Payer: Self-pay | Admitting: *Deleted

## 2021-12-19 MED ORDER — XULANE 150-35 MCG/24HR TD PTWK: 1.0000 | MEDICATED_PATCH | TRANSDERMAL | 0 refills | Status: DC

## 2022-01-22 ENCOUNTER — Ambulatory Visit: Payer: Medicaid Other | Admitting: Adult Health

## 2022-12-17 ENCOUNTER — Ambulatory Visit (INDEPENDENT_AMBULATORY_CARE_PROVIDER_SITE_OTHER): Payer: Medicaid Other | Admitting: Adult Health

## 2022-12-17 ENCOUNTER — Encounter: Payer: Self-pay | Admitting: Adult Health

## 2022-12-17 VITALS — BP 123/72 | HR 64 | Ht 67.0 in | Wt 124.5 lb

## 2022-12-17 DIAGNOSIS — Z30013 Encounter for initial prescription of injectable contraceptive: Secondary | ICD-10-CM | POA: Diagnosis not present

## 2022-12-17 DIAGNOSIS — Z3202 Encounter for pregnancy test, result negative: Secondary | ICD-10-CM | POA: Diagnosis not present

## 2022-12-17 LAB — POCT URINE PREGNANCY: Preg Test, Ur: NEGATIVE

## 2022-12-17 MED ORDER — MEDROXYPROGESTERONE ACETATE 150 MG/ML IM SUSP: 150.0000 mg | INTRAMUSCULAR | 4 refills | Status: DC

## 2022-12-17 MED ORDER — MEDROXYPROGESTERONE ACETATE 150 MG/ML IM SUSY
150.0000 mg | PREFILLED_SYRINGE | Freq: Once | INTRAMUSCULAR | Status: AC
  Administered 2022-12-17: 150 mg via INTRAMUSCULAR

## 2022-12-17 NOTE — Progress Notes (Signed)
Subjective:     Patient ID: Sharon Webb, female   DOB: Jul 31, 2000, 22 y.o.   MRN: [id number]  HPI Sharon Webb is a 22 year old black female, with DP, G1P1001 in to discuss birth control, she wants depo. Last sex last night with condom.     Component Value Date/Time   DIAGPAP  08/15/2020 0925    - Negative for intraepithelial lesion or malignancy (NILM)   ADEQPAP  08/15/2020 0925    Satisfactory for evaluation; transformation zone component PRESENT.    PCP is Dr Talmage Nap  Review of Systems Periods regular Reviewed past medical,surgical, social and family history. Reviewed medications and allergies.     Objective:   Physical Exam BP 123/72 (BP Location: Left Arm, Patient Position: Sitting, Cuff Size: Normal)   Pulse 64   Ht 5\' 7"  (1.702 m)   Wt 124 lb 8 oz (56.5 kg)   LMP 11/22/2022   BMI 19.50 kg/m  UPT is negative Skin warm and dry.  Lungs: clear to ausculation bilaterally. Cardiovascular: regular rate and rhythm.      Upstream - 12/17/22 1217       Pregnancy Intention Screening   Does the patient want to become pregnant in the next year? No    Does the patient's partner want to become pregnant in the next year? No    Would the patient like to discuss contraceptive options today? Yes      Contraception Wrap Up   Current Method Female Condom    End Method Hormonal Injection    Contraception Counseling Provided Yes    How was the end contraceptive method provided? Prescription   first injection in office provided            Assessment:     1. Pregnancy examination or test, negative result - POCT urine pregnancy  2. Encounter for initial prescription of injectable contraceptive First depo in office and rx sent Use condoms for 2 weeks Meds ordered this encounter  Medications   medroxyPROGESTERone (DEPO-PROVERA) 150 MG/ML injection    Sig: Inject 1 mL (150 mg total) into the muscle every 3 (three) months.    Dispense:  1 mL    Refill:  4    Order Specific  Question:   Supervising Provider    Answer:   Duane Lope H [[id number]]   medroxyPROGESTERone Acetate SUSY 150 mg    - medroxyPROGESTERone Acetate SUSY 150 mg     Plan:     Follow up in 12 weeks for depo

## 2023-03-11 ENCOUNTER — Ambulatory Visit: Payer: Medicaid Other

## 2023-03-18 ENCOUNTER — Ambulatory Visit: Payer: Medicaid Other

## 2023-03-28 ENCOUNTER — Telehealth: Payer: Medicaid Other | Admitting: Adult Health

## 2023-04-01 ENCOUNTER — Encounter: Payer: Self-pay | Admitting: Adult Health

## 2023-04-01 ENCOUNTER — Ambulatory Visit: Payer: Medicaid Other | Admitting: Adult Health

## 2023-04-01 VITALS — Ht 67.0 in | Wt 135.0 lb

## 2023-04-01 DIAGNOSIS — Z30016 Encounter for initial prescription of transdermal patch hormonal contraceptive device: Secondary | ICD-10-CM | POA: Diagnosis not present

## 2023-04-01 MED ORDER — NORELGESTROMIN-ETH ESTRADIOL 150-35 MCG/24HR TD PTWK: 1.0000 | MEDICATED_PATCH | TRANSDERMAL | 12 refills | Status: AC

## 2023-04-01 NOTE — Progress Notes (Signed)
 Patient ID: Sharon Webb, female   DOB: January 01, 2001, 23 y.o.   MRN: [id number]   TELEHEALTH GYNECOLOGY VISIT ENCOUNTER NOTE  [facility]: [facility]   Patient location: Home  I connected with Sharon Webb on 04/01/23 at  2:50 PM EST by telephone and verified that I am speaking with the correct person using two identifiers. Patient was unable to do MyChart audiovisual encounter due to technical difficulties, she tried several times.    I discussed the limitations, risks, security and privacy concerns of performing an evaluation and management service by telephone and the availability of in person appointments. I also discussed with the patient that there may be a patient responsible charge related to this service. The patient expressed understanding and agreed to proceed.   History:  Sharon Webb is a 23 y.o. G21P1001 female being evaluated today for getting on the birth control patch, has used in the past, missed depo in December. She denies any abnormal vaginal discharge, bleeding, pelvic pain or other concerns.       Past Medical History:  Diagnosis Date   Medical history non-contributory    Past Surgical History:  Procedure Laterality Date   NO PAST SURGERIES     The following portions of the patient's history were reviewed and updated as appropriate: allergies, current medications, past family history, past medical history, past social history, past surgical history and problem list.   Health Maintenance:  Normal pap 08/15/20.   Review of Systems:  Pertinent items noted in HPI and remainder of comprehensive ROS otherwise negative.  Physical Exam:   General:  Alert, oriented and cooperative.   Mental Status: Normal mood and affect perceived. Normal judgment and thought content.  Physical exam deferred due to nature of the encounter Ht 5' 7 (1.702 m)   Wt 135 lb (61.2 kg)   LMP 03/27/2023   BMI 21.14 kg/m   Labs and Imaging No  results found for this or any previous visit (from the past 2 weeks). No results found.    Assessment and Plan:     1. Encounter for initial prescription of transdermal patch hormonal contraceptive device (Primary) Denies MI,stroke, DVT,breast cancer or migraine with aura Check HPT and then start patch if negative Will rx Xulane  Meds ordered this encounter  Medications   norelgestromin -ethinyl estradiol  (XULANE ) 150-35 MCG/24HR transdermal patch    Sig: Place 1 patch onto the skin once a week.    Dispense:  3 patch    Refill:  12    Supervising Provider:   JAYNE VONN Webb [[id number]]       Return about 08/22/23 for pap and physical and ROS   I discussed the assessment and treatment plan with the patient. The patient was provided an opportunity to ask questions and all were answered. The patient agreed with the plan and demonstrated an understanding of the instructions.   The patient was advised to call back or seek an in-person evaluation/go to the ED if the symptoms worsen or if the condition fails to improve as anticipated.  I provided 9 minutes of non-face-to-face time during this encounter.   Delon Lewis, NP [facility] [employer], [facility]

## 2023-08-22 ENCOUNTER — Ambulatory Visit: Payer: Medicaid Other | Admitting: Adult Health
